# Patient Record
Sex: Male | Born: 1975 | Race: White | Hispanic: No | Marital: Single | Smoking: Current every day smoker
Health system: Southern US, Community
[De-identification: ages and names within clinical notes are randomized; demographics above are authoritative.]

## PROBLEM LIST (undated history)

## (undated) DIAGNOSIS — K5792 Diverticulitis of intestine, part unspecified, without perforation or abscess without bleeding: Secondary | ICD-10-CM

## (undated) DIAGNOSIS — F419 Anxiety disorder, unspecified: Secondary | ICD-10-CM

## (undated) DIAGNOSIS — I10 Essential (primary) hypertension: Secondary | ICD-10-CM

## (undated) DIAGNOSIS — L732 Hidradenitis suppurativa: Secondary | ICD-10-CM

## (undated) DIAGNOSIS — K219 Gastro-esophageal reflux disease without esophagitis: Secondary | ICD-10-CM

## (undated) DIAGNOSIS — K631 Perforation of intestine (nontraumatic): Secondary | ICD-10-CM

## (undated) HISTORY — DX: Hidradenitis suppurativa: L73.2

## (undated) HISTORY — PX: COLONOSCOPY: SHX174

---

## 2002-04-06 ENCOUNTER — Encounter: Payer: Self-pay | Admitting: Emergency Medicine

## 2002-04-06 ENCOUNTER — Emergency Department (HOSPITAL_COMMUNITY): Admission: EM | Admit: 2002-04-06 | Discharge: 2002-04-06 | Payer: Self-pay | Admitting: Emergency Medicine

## 2003-10-19 ENCOUNTER — Emergency Department (HOSPITAL_COMMUNITY): Admission: EM | Admit: 2003-10-19 | Discharge: 2003-10-20 | Payer: Self-pay | Admitting: Emergency Medicine

## 2008-08-21 ENCOUNTER — Encounter (INDEPENDENT_AMBULATORY_CARE_PROVIDER_SITE_OTHER): Payer: Self-pay | Admitting: *Deleted

## 2009-05-01 ENCOUNTER — Ambulatory Visit: Payer: Self-pay | Admitting: Internal Medicine

## 2009-05-01 DIAGNOSIS — R1084 Generalized abdominal pain: Secondary | ICD-10-CM

## 2009-05-01 DIAGNOSIS — R198 Other specified symptoms and signs involving the digestive system and abdomen: Secondary | ICD-10-CM

## 2009-05-01 DIAGNOSIS — K219 Gastro-esophageal reflux disease without esophagitis: Secondary | ICD-10-CM | POA: Insufficient documentation

## 2009-05-01 LAB — CONVERTED CEMR LAB
ALT: 30 units/L (ref 0–53)
AST: 21 units/L (ref 0–37)
Albumin: 4.3 g/dL (ref 3.5–5.2)
Alkaline Phosphatase: 68 units/L (ref 39–117)
BUN: 9 mg/dL (ref 6–23)
Basophils Absolute: 0 10*3/uL (ref 0.0–0.1)
Basophils Relative: 0.1 % (ref 0.0–3.0)
Bilirubin, Direct: 0.2 mg/dL (ref 0.0–0.3)
CO2: 30 meq/L (ref 19–32)
Calcium: 9.2 mg/dL (ref 8.4–10.5)
Chloride: 106 meq/L (ref 96–112)
Creatinine, Ser: 0.8 mg/dL (ref 0.4–1.5)
Eosinophils Absolute: 0.1 10*3/uL (ref 0.0–0.7)
Eosinophils Relative: 1 % (ref 0.0–5.0)
GFR calc non Af Amer: 118.94 mL/min (ref >60)
Glucose, Bld: 96 mg/dL (ref 70–99)
HCT: 50.8 % (ref 39.0–52.0)
Hemoglobin: 17.4 g/dL — ABNORMAL HIGH (ref 13.0–17.0)
Lymphocytes Relative: 25.7 % (ref 12.0–46.0)
Lymphs Abs: 2 10*3/uL (ref 0.7–4.0)
MCHC: 34.2 g/dL (ref 30.0–36.0)
MCV: 95 fL (ref 78.0–100.0)
Monocytes Absolute: 0.7 10*3/uL (ref 0.1–1.0)
Monocytes Relative: 8.6 % (ref 3.0–12.0)
Neutro Abs: 5 10*3/uL (ref 1.4–7.7)
Neutrophils Relative %: 64.6 % (ref 43.0–77.0)
Platelets: 206 10*3/uL (ref 150.0–400.0)
Potassium: 4.3 meq/L (ref 3.5–5.1)
RBC: 5.34 M/uL (ref 4.22–5.81)
RDW: 12.7 % (ref 11.5–14.6)
Sodium: 142 meq/L (ref 135–145)
TSH: 0.53 microintl units/mL (ref 0.35–5.50)
Tissue Transglutaminase Ab, IgA: 0.4 units (ref <7)
Total Bilirubin: 0.9 mg/dL (ref 0.3–1.2)
Total Protein: 7.4 g/dL (ref 6.0–8.3)
WBC: 7.8 10*3/uL (ref 4.5–10.5)

## 2009-05-15 ENCOUNTER — Ambulatory Visit: Payer: Self-pay | Admitting: Internal Medicine

## 2009-06-19 ENCOUNTER — Telehealth: Payer: Self-pay | Admitting: Internal Medicine

## 2009-06-20 ENCOUNTER — Telehealth: Payer: Self-pay | Admitting: Gastroenterology

## 2009-06-22 ENCOUNTER — Telehealth: Payer: Self-pay | Admitting: Internal Medicine

## 2012-03-18 ENCOUNTER — Other Ambulatory Visit: Payer: Self-pay | Admitting: Gastroenterology

## 2012-03-18 DIAGNOSIS — R1011 Right upper quadrant pain: Secondary | ICD-10-CM

## 2012-03-29 ENCOUNTER — Ambulatory Visit
Admission: RE | Admit: 2012-03-29 | Discharge: 2012-03-29 | Disposition: A | Discharge status: Home / Self Care | Payer: BC Managed Care – PPO | Source: Ambulatory Visit | Attending: Gastroenterology | Admitting: Gastroenterology

## 2012-03-29 DIAGNOSIS — R1011 Right upper quadrant pain: Secondary | ICD-10-CM

## 2013-11-20 ENCOUNTER — Emergency Department: Payer: Self-pay | Admitting: Emergency Medicine

## 2013-11-20 LAB — BASIC METABOLIC PANEL
Anion Gap: 6 — ABNORMAL LOW (ref 7–16)
BUN: 7 mg/dL (ref 7–18)
CALCIUM: 8.8 mg/dL (ref 8.5–10.1)
CO2: 27 mmol/L (ref 21–32)
Chloride: 103 mmol/L (ref 98–107)
Creatinine: 0.88 mg/dL (ref 0.60–1.30)
EGFR (African American): >60
EGFR (Non-African Amer.): >60
GLUCOSE: 86 mg/dL (ref 65–99)
OSMOLALITY: 269 (ref 275–301)
Potassium: 3.4 mmol/L — ABNORMAL LOW (ref 3.5–5.1)
SODIUM: 136 mmol/L (ref 136–145)

## 2013-11-20 LAB — CBC WITH DIFFERENTIAL/PLATELET
Basophil #: 0.1 10*3/uL (ref 0.0–0.1)
Basophil %: 0.7 %
EOS PCT: 0.3 %
Eosinophil #: 0 10*3/uL (ref 0.0–0.7)
HCT: 52 % (ref 40.0–52.0)
HGB: 17.4 g/dL (ref 13.0–18.0)
LYMPHS PCT: 17.1 %
Lymphocyte #: 2.4 10*3/uL (ref 1.0–3.6)
MCH: 31.4 pg (ref 26.0–34.0)
MCHC: 33.4 g/dL (ref 32.0–36.0)
MCV: 94 fL (ref 80–100)
Monocyte #: 1.4 x10 3/mm — ABNORMAL HIGH (ref 0.2–1.0)
Monocyte %: 9.7 %
NEUTROS PCT: 72.2 %
Neutrophil #: 10.3 10*3/uL — ABNORMAL HIGH (ref 1.4–6.5)
Platelet: 220 10*3/uL (ref 150–440)
RBC: 5.53 10*6/uL (ref 4.40–5.90)
RDW: 13.7 % (ref 11.5–14.5)
WBC: 14.2 10*3/uL — AB (ref 3.8–10.6)

## 2013-11-25 LAB — WOUND CULTURE

## 2014-05-17 DIAGNOSIS — L732 Hidradenitis suppurativa: Secondary | ICD-10-CM | POA: Insufficient documentation

## 2014-06-07 DIAGNOSIS — Z720 Tobacco use: Secondary | ICD-10-CM | POA: Insufficient documentation

## 2015-01-11 ENCOUNTER — Ambulatory Visit
Admit: 2015-01-11 | Disposition: A | Discharge status: Home / Self Care | Payer: Self-pay | Attending: Family Medicine | Admitting: Family Medicine

## 2015-04-05 ENCOUNTER — Encounter: Payer: Self-pay | Admitting: Internal Medicine

## 2015-05-26 ENCOUNTER — Emergency Department: Payer: 59

## 2015-05-26 ENCOUNTER — Other Ambulatory Visit: Payer: Self-pay

## 2015-05-26 ENCOUNTER — Encounter: Payer: Self-pay | Admitting: Emergency Medicine

## 2015-05-26 ENCOUNTER — Inpatient Hospital Stay
Admission: EM | Admit: 2015-05-26 | Discharge: 2015-05-30 | DRG: 392 | Disposition: A | Discharge status: Home / Self Care | Payer: 59 | Attending: Internal Medicine | Admitting: Internal Medicine

## 2015-05-26 DIAGNOSIS — K5792 Diverticulitis of intestine, part unspecified, without perforation or abscess without bleeding: Secondary | ICD-10-CM | POA: Diagnosis not present

## 2015-05-26 DIAGNOSIS — K219 Gastro-esophageal reflux disease without esophagitis: Secondary | ICD-10-CM | POA: Diagnosis present

## 2015-05-26 DIAGNOSIS — A419 Sepsis, unspecified organism: Secondary | ICD-10-CM

## 2015-05-26 DIAGNOSIS — F17210 Nicotine dependence, cigarettes, uncomplicated: Secondary | ICD-10-CM | POA: Diagnosis present

## 2015-05-26 DIAGNOSIS — Z8249 Family history of ischemic heart disease and other diseases of the circulatory system: Secondary | ICD-10-CM | POA: Diagnosis not present

## 2015-05-26 DIAGNOSIS — Z833 Family history of diabetes mellitus: Secondary | ICD-10-CM

## 2015-05-26 DIAGNOSIS — F419 Anxiety disorder, unspecified: Secondary | ICD-10-CM | POA: Diagnosis present

## 2015-05-26 DIAGNOSIS — E860 Dehydration: Secondary | ICD-10-CM | POA: Diagnosis present

## 2015-05-26 DIAGNOSIS — I10 Essential (primary) hypertension: Secondary | ICD-10-CM | POA: Diagnosis present

## 2015-05-26 DIAGNOSIS — K5720 Diverticulitis of large intestine with perforation and abscess without bleeding: Principal | ICD-10-CM | POA: Diagnosis present

## 2015-05-26 DIAGNOSIS — K631 Perforation of intestine (nontraumatic): Secondary | ICD-10-CM | POA: Diagnosis present

## 2015-05-26 HISTORY — DX: Essential (primary) hypertension: I10

## 2015-05-26 HISTORY — DX: Gastro-esophageal reflux disease without esophagitis: K21.9

## 2015-05-26 HISTORY — DX: Anxiety disorder, unspecified: F41.9

## 2015-05-26 HISTORY — DX: Diverticulitis of intestine, part unspecified, without perforation or abscess without bleeding: K57.92

## 2015-05-26 LAB — COMPREHENSIVE METABOLIC PANEL
ALK PHOS: 66 U/L (ref 38–126)
ALT: 25 U/L (ref 17–63)
ANION GAP: 10 (ref 5–15)
AST: 20 U/L (ref 15–41)
Albumin: 4 g/dL (ref 3.5–5.0)
BILIRUBIN TOTAL: 1.6 mg/dL — AB (ref 0.3–1.2)
BUN: 8 mg/dL (ref 6–20)
CALCIUM: 9.1 mg/dL (ref 8.9–10.3)
CO2: 25 mmol/L (ref 22–32)
Chloride: 102 mmol/L (ref 101–111)
Creatinine, Ser: 0.84 mg/dL (ref 0.61–1.24)
GFR calc non Af Amer: >60 mL/min (ref >60)
GLUCOSE: 91 mg/dL (ref 65–99)
Potassium: 3.6 mmol/L (ref 3.5–5.1)
Sodium: 137 mmol/L (ref 135–145)
TOTAL PROTEIN: 7.3 g/dL (ref 6.5–8.1)

## 2015-05-26 LAB — URINALYSIS COMPLETE WITH MICROSCOPIC (ARMC ONLY)
Bacteria, UA: NONE SEEN
Bilirubin Urine: NEGATIVE
Glucose, UA: NEGATIVE mg/dL
Hgb urine dipstick: NEGATIVE
Leukocytes, UA: NEGATIVE
Nitrite: NEGATIVE
Protein, ur: NEGATIVE mg/dL
Specific Gravity, Urine: 1.013 (ref 1.005–1.030)
pH: 7 (ref 5.0–8.0)

## 2015-05-26 LAB — CBC WITH DIFFERENTIAL/PLATELET
Basophils Absolute: 0.1 10*3/uL (ref 0–0.1)
Basophils Relative: 0 %
EOS PCT: 0 %
Eosinophils Absolute: 0 10*3/uL (ref 0–0.7)
HCT: 49 % (ref 40.0–52.0)
Hemoglobin: 16.8 g/dL (ref 13.0–18.0)
LYMPHS ABS: 1.5 10*3/uL (ref 1.0–3.6)
LYMPHS PCT: 9 %
MCH: 31.9 pg (ref 26.0–34.0)
MCHC: 34.3 g/dL (ref 32.0–36.0)
MCV: 93.1 fL (ref 80.0–100.0)
MONO ABS: 1.8 10*3/uL — AB (ref 0.2–1.0)
Monocytes Relative: 12 %
Neutro Abs: 12.5 10*3/uL — ABNORMAL HIGH (ref 1.4–6.5)
Neutrophils Relative %: 79 %
PLATELETS: 210 10*3/uL (ref 150–440)
RBC: 5.27 MIL/uL (ref 4.40–5.90)
RDW: 14.1 % (ref 11.5–14.5)
WBC: 15.9 10*3/uL — ABNORMAL HIGH (ref 3.8–10.6)

## 2015-05-26 LAB — LIPASE, BLOOD: Lipase: 23 U/L (ref 22–51)

## 2015-05-26 LAB — TROPONIN I

## 2015-05-26 MED ORDER — ONDANSETRON HCL 4 MG PO TABS
4.0000 mg | ORAL_TABLET | Freq: Four times a day (QID) | ORAL | Status: DC | PRN
Start: 2015-05-26 — End: 2015-05-30
  Administered 2015-05-28: 4 mg via ORAL
  Filled 2015-05-26: qty 1

## 2015-05-26 MED ORDER — SODIUM CHLORIDE 0.9 % IV BOLUS (SEPSIS)
1000.0000 mL | Freq: Once | INTRAVENOUS | Status: AC
  Administered 2015-05-26: 1000 mL via INTRAVENOUS

## 2015-05-26 MED ORDER — ONDANSETRON HCL 4 MG/2ML IJ SOLN
4.0000 mg | Freq: Four times a day (QID) | INTRAMUSCULAR | Status: DC | PRN
  Administered 2015-05-26 – 2015-05-28 (×4): 4 mg via INTRAVENOUS
  Filled 2015-05-26 (×4): qty 2

## 2015-05-26 MED ORDER — SODIUM CHLORIDE 0.9 % IV SOLN
INTRAVENOUS | Status: AC
  Administered 2015-05-27: 01:00:00 via INTRAVENOUS

## 2015-05-26 MED ORDER — ONDANSETRON HCL 4 MG/2ML IJ SOLN
4.0000 mg | Freq: Once | INTRAMUSCULAR | Status: AC
  Administered 2015-05-26: 4 mg via INTRAVENOUS
  Filled 2015-05-26: qty 2

## 2015-05-26 MED ORDER — SODIUM CHLORIDE 0.9 % IV SOLN
1.0000 g | Freq: Three times a day (TID) | INTRAVENOUS | Status: DC
  Administered 2015-05-27 – 2015-05-30 (×11): 1 g via INTRAVENOUS
  Filled 2015-05-26 (×15): qty 1

## 2015-05-26 MED ORDER — MORPHINE SULFATE (PF) 4 MG/ML IV SOLN
4.0000 mg | Freq: Once | INTRAVENOUS | Status: AC
  Administered 2015-05-26: 4 mg via INTRAVENOUS

## 2015-05-26 MED ORDER — SODIUM CHLORIDE 0.9 % IV SOLN
Freq: Once | INTRAVENOUS | Status: AC
  Administered 2015-05-26: 17:00:00 via INTRAVENOUS

## 2015-05-26 MED ORDER — SODIUM CHLORIDE 0.9 % IJ SOLN
3.0000 mL | Freq: Two times a day (BID) | INTRAMUSCULAR | Status: DC
  Administered 2015-05-27 – 2015-05-30 (×6): 3 mL via INTRAVENOUS

## 2015-05-26 MED ORDER — IOHEXOL 240 MG/ML SOLN
25.0000 mL | Freq: Once | INTRAMUSCULAR | Status: AC | PRN
  Administered 2015-05-26: 25 mL via ORAL

## 2015-05-26 MED ORDER — MORPHINE SULFATE (PF) 4 MG/ML IV SOLN
INTRAVENOUS | Status: AC
  Filled 2015-05-26: qty 1

## 2015-05-26 MED ORDER — MORPHINE SULFATE (PF) 4 MG/ML IV SOLN
6.0000 mg | Freq: Once | INTRAVENOUS | Status: AC
  Administered 2015-05-26: 6 mg via INTRAVENOUS
  Filled 2015-05-26: qty 2

## 2015-05-26 MED ORDER — PANTOPRAZOLE SODIUM 40 MG PO TBEC
40.0000 mg | DELAYED_RELEASE_TABLET | Freq: Every day | ORAL | Status: DC
  Administered 2015-05-27 – 2015-05-29 (×3): 40 mg via ORAL
  Filled 2015-05-26 (×5): qty 1

## 2015-05-26 MED ORDER — IOHEXOL 350 MG/ML SOLN
100.0000 mL | Freq: Once | INTRAVENOUS | Status: AC | PRN
  Administered 2015-05-26: 100 mL via INTRAVENOUS

## 2015-05-26 MED ORDER — MORPHINE SULFATE (PF) 4 MG/ML IV SOLN
8.0000 mg | Freq: Once | INTRAVENOUS | Status: AC
  Administered 2015-05-26: 8 mg via INTRAVENOUS
  Filled 2015-05-26 (×2): qty 2

## 2015-05-26 MED ORDER — ACETAMINOPHEN 325 MG PO TABS
650.0000 mg | ORAL_TABLET | Freq: Four times a day (QID) | ORAL | Status: DC | PRN
  Administered 2015-05-28 – 2015-05-29 (×2): 650 mg via ORAL
  Filled 2015-05-26 (×2): qty 2

## 2015-05-26 MED ORDER — ACETAMINOPHEN 650 MG RE SUPP: 650.0000 mg | Freq: Four times a day (QID) | RECTAL | Status: DC | PRN

## 2015-05-26 MED ORDER — ENOXAPARIN SODIUM 40 MG/0.4ML ~~LOC~~ SOLN
40.0000 mg | SUBCUTANEOUS | Status: DC
  Administered 2015-05-27 – 2015-05-29 (×4): 40 mg via SUBCUTANEOUS
  Filled 2015-05-26 (×4): qty 0.4

## 2015-05-26 MED ORDER — MORPHINE SULFATE (PF) 2 MG/ML IV SOLN
2.0000 mg | INTRAVENOUS | Status: DC | PRN
  Administered 2015-05-26 – 2015-05-30 (×16): 2 mg via INTRAVENOUS
  Filled 2015-05-26 (×16): qty 1

## 2015-05-26 MED ORDER — CLONAZEPAM 0.5 MG PO TABS
0.5000 mg | ORAL_TABLET | Freq: Two times a day (BID) | ORAL | Status: DC | PRN
Start: 2015-05-26 — End: 2015-05-30
  Administered 2015-05-27 – 2015-05-29 (×5): 0.5 mg via ORAL
  Filled 2015-05-26 (×5): qty 1

## 2015-05-26 NOTE — ED Notes (Signed)
Admitting MD at bedside.

## 2015-05-26 NOTE — ED Notes (Addendum)
Patient to ER with mother for c/o "diverticulitis pain". Patient rolling around on stretcher in triage. Mother states she is concerned patient is going "to die" and wants to see GI specialist. Mother states "patient needs immediate attention". Unable to obtain accurate triage d/t patient condition/family input.

## 2015-05-26 NOTE — ED Provider Notes (Signed)
[facility] Emergency Department Provider Note  ____________________________________________  Time seen: Approximately 4:13 PM  I have reviewed the triage vital signs and the nursing notes.   HISTORY  Chief Complaint Abdominal Pain  Patient reports he feels something horribly bad is going on in his stomach has a severe burning pain in his lower abdomen  HPI Tanner Wells is a 39 y.o. male who gives me the history of lower abdominal pain and burning in nature starting on Thursday 3 days ago. The pain is burning in nature he is very nauseated. She went to see [facility] early today and got 2 different antibiotics which was stolen. He is not any better in fact he's worse. She convinced that something is twisted her ribs in his abdomen. Patient complains of severe abdominal pain and was unable to sleep last night because of it. Patient denies any sun exposure but is very red especially in the face. His mother tells me that this is new for him. Patient reports that the bumps in the road or sitting down makes the pain worse and nothing really makes it better except possibly laying still   Past Medical History  Diagnosis Date  . Diverticulitis   . Hypertension   . Anxiety   . GERD (gastroesophageal reflux disease)     Patient Active Problem List   Diagnosis Date Noted  . Acute diverticulitis 05/26/2015  . 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 Benign essential HTN 05/26/2015  . Anxiety 05/26/2015  . Dehydration 05/26/2015  . GERD (gastroesophageal reflux disease) 05/26/2015  . Sepsis 05/26/2015  . GERD 05/01/2009  . OTHER SYMPTOMS INVOLVING DIGESTIVE SYSTEM OTHER 05/01/2009  . ABDOMINAL PAIN -GENERALIZED 05/01/2009   patient says he has irritable bowel disease and diverticulitis  Past Surgical History  Procedure Laterality Date  . Colonoscopy      No current outpatient prescriptions on file.  Allergies Bee venom  Family History  Problem  Relation Age of Onset  . Hypertension Father   . Lung cancer Maternal Uncle   . Lung cancer Maternal Aunt   . Diabetes Mother     Social History Social History  Substance Use Topics  . Smoking status: Current Every Day Smoker -- 1.00 packs/day    Types: Cigarettes  . Smokeless tobacco: Current User  . Alcohol Use: Yes     Comment: 6 drinks/week   patient smokes  Review of Systems Constitutional: No fever/chills Eyes: No visual changes. ENT: No sore throat. Patient says he has a metallic taste in his mouth Cardiovascular: Denies chest pain. Respiratory: Denies shortness of breath. Gastrointestinal: Patient has abdominal pain and nausea no vomiting.  No diarrhea.  No constipation. Genitourinary: Patient has some burning when he urinates but not much Musculoskeletal: Negative for back pain. Skin: Negative for rash. Neurological: Negative for headaches, focal weakness or numbness.  10-point ROS otherwise negative.  ____________________________________________   PHYSICAL EXAM:  VITAL SIGNS: ED Triage Vitals  Enc Vitals Group     BP 05/26/15 1549 162/81 mmHg     Pulse Rate 05/26/15 1549 106     Resp 05/26/15 1549 20     Temp 05/26/15 1549 98.4 F (36.9 C)     Temp Source 05/26/15 1549 Oral     SpO2 05/26/15 1549 99 %     Weight 05/26/15 1549 250 lb (113.399 kg)     Height 05/26/15 1549  (1.778 m)     Head Cir --      Peak Flow --  Pain Score --      Pain Loc --      Pain Edu? --      Excl. in GC? --     Constitutional: Alert and oriented. Patient is very flushed and says he's hurting a lot Eyes: Conjunctivae are normal. PERRL. EOMI. Head: Atraumatic. Nose: No congestion/rhinnorhea. Mouth/Throat: Mucous membranes are moist.  Oropharynx non-erythematous. Neck: No stridor. Cardiovascular: Normal rate, regular rhythm. Grossly normal heart sounds.  Good peripheral circulation. Respiratory: Normal respiratory effort.  No retractions. Lungs  CTAB. Gastrointestinal: Soft patient is tender in the lower half of his abdomen to palpation and percussion. The tenderness increases the lower in the abdomen ago bowel sounds are decreased No distention. No abdominal bruits. No CVA tenderness. Musculoskeletal: No lower extremity tenderness nor edema.  No joint effusions. Neurologic:  Normal speech and language. No gross focal neurologic deficits are appreciated. No gait instability. Skin:  Skin is warm, dry and intact. No rash noted. Psychiatric: Mood and affect are normal. Speech and behavior are normal.  ____________________________________________   LABS (all labs ordered are listed, but only abnormal results are displayed)  Labs Reviewed  COMPREHENSIVE METABOLIC PANEL - Abnormal; Notable for the following:    Total Bilirubin 1.6 (*)    All other components within normal limits  CBC WITH DIFFERENTIAL/PLATELET - Abnormal; Notable for the following:    WBC 15.9 (*)    Neutro Abs 12.5 (*)    Monocytes Absolute 1.8 (*)    All other components within normal limits  URINALYSIS COMPLETEWITH MICROSCOPIC ([facility] ONLY) - Abnormal; Notable for the following:    Color, Urine YELLOW (*)    APPearance CLEAR (*)    Ketones, ur 1+ (*)    Squamous Epithelial / LPF 0-5 (*)    All other components within normal limits  CBC - Abnormal; Notable for the following:    WBC 16.6 (*)    All other components within normal limits  CULTURE, BLOOD (ROUTINE X 2)  CULTURE, BLOOD (ROUTINE X 2)  LIPASE, BLOOD  TROPONIN I  BASIC METABOLIC PANEL  LACTIC ACID, PLASMA  URINALYSIS COMPLETEWITH MICROSCOPIC ([facility] ONLY)   ____________________________________________  EKG  EKG read and interpreted by me shows normal sinus rhythm normal axis and normal ST-T wave normal EKG ____________________________________________  RADIOLOGY  CT per radiology shows diverticulitis with perforation 1 cm free air. I have reviewed the films and  agree ____________________________________________   PROCEDURES    ____________________________________________   INITIAL IMPRESSION / ASSESSMENT AND PLAN / ED COURSE  Pertinent labs & imaging results that were available during my care of the patient were reviewed by me and considered in my medical decision making (see chart for details).  _________________________________________   FINAL CLINICAL IMPRESSION(S) / ED DIAGNOSES  Final diagnoses:  Diverticulitis of large intestine with perforation without bleeding      Arnaldo Natal, MD 05/27/15 0021

## 2015-05-26 NOTE — ED Notes (Signed)
Pt's family state pt went to [facility] this morning with abd pain and was diagnosed with diverticulitis flare-up and given antibiotics. Pt presents to ED c/o abd pain that has worsened since visit to [facility], 10/10 to [facility].

## 2015-05-26 NOTE — ED Notes (Signed)
Patient returned to room from CT. 

## 2015-05-26 NOTE — ED Notes (Signed)
Patient transported to CT 

## 2015-05-26 NOTE — Consult Note (Signed)
Tanner Wells is a 39 y.o. male  several days of abdominal pain.  HPI: He has a history of previous episodes of diverticulitis. He's never been hospitalized for that problem. He's not been treated with antibiotics as far as he is aware. He's had several days of increasing abdominal pain culminating in severe episode this evening. He presented emergently for further evaluation.  He also has mild nausea but has not vomited. He's had a significant fever and has had multiple shaking chills. He is having reasonable bowel function has noticed some blood in his stool. He's had no previous abdominal surgery. Specifically he denies history of hepatitis, yellow jaundice, pancreatitis, peptic ulcer disease, or gallbladder disease. He is no other major medical problems. His cigarette smoker.  Workup in the emergency revealed an elevated white blood cell count and CT demonstrates what appears to be  perforated diverticulitis in the sigmoid area. Surgical service was consulted  Past Medical History  Diagnosis Date  . Diverticulitis   . Hypertension   . Anxiety   . GERD (gastroesophageal reflux disease)    Past Surgical History  Procedure Laterality Date  . Colonoscopy     Social History   Social History  . Marital Status: Single    Spouse Name: N/A  . Number of Children: N/A  . Years of Education: N/A   Social History Main Topics  . Smoking status: Current Every Day Smoker -- 1.00 packs/day    Types: Cigarettes  . Smokeless tobacco: Current User  . Alcohol Use: Yes     Comment: 6 drinks/week  . Drug Use: Yes    Special: Marijuana  . Sexual Activity: Not Asked   Other Topics Concern  . None   Social History Narrative  . None    Review of Systems: Review of Systems  Constitutional: Positive for fever, chills and diaphoresis. Negative for weight loss and malaise/fatigue.  HENT: Negative.   Eyes: Negative.   Respiratory: Negative.   Cardiovascular: Negative.    Gastrointestinal: Positive for nausea and abdominal pain. Negative for vomiting.  Genitourinary: Negative.   Musculoskeletal: Negative.   Skin: Negative.   Neurological: Negative.   Psychiatric/Behavioral: Negative.     PHYSICAL EXAM: BP 130/86 mmHg  Pulse 96  Temp(Src) 98 F (36.7 C) (Oral)  Resp 21  Ht  (1.778 m)  Wt 265 lb 8 oz (120.43 kg)  BMI 38.10 kg/m2  SpO2 100%  Physical Exam  Constitutional: He is oriented to person, place, and time. He appears well-developed and well-nourished.  Flushed  HENT:  Head: Normocephalic and atraumatic.  Eyes: EOM are normal. Pupils are equal, round, and reactive to light.  Neck: Normal range of motion. Neck supple.  Cardiovascular: Regular rhythm and normal heart sounds.   Pulmonary/Chest: Effort normal and breath sounds normal.  Abdominal: Soft. There is tenderness. There is guarding. There is no rebound.  Musculoskeletal: Normal range of motion. He exhibits no edema or tenderness.  Neurological: He is alert and oriented to person, place, and time.  Skin: Skin is warm and dry.  Psychiatric: He has a normal mood and affect. Judgment normal.   His abdomen is markedly tender to suprapubic left lower quadrant area with marked guarding. He has no rebound. He has hypoactive bowel sounds.  Impression/Plan: I independently reviewed his CT scan. He does appear to have a perforated diverticulitis in the sigmoid area. There is some stranding free air or free fluid. There is no specific abscess formation. I talk with the patient  in detail. His family is present. We will plan antibiotic therapy and follow-up over the next couple of days. He will need surgery at some point to resect this involved segment of sigmoid. If we can delayed several weeks to months when he likely can avoid a temporary colostomy. The patient and his family in agreement with this plan.   Tiney Rouge III, MD  05/26/2015, 9:37 PM

## 2015-05-26 NOTE — Progress Notes (Signed)
ANTIBIOTIC CONSULT NOTE - INITIAL  Pharmacy Consult for meropenem Indication: [facility]  Allergies  Allergen Reactions  . Bee Venom Swelling    Patient Measurements: Height:  (177.8 cm) Weight: 255 lb 3.2 oz (115.758 kg) IBW/kg (Calculated) : 73 Adjusted Body Weight: 90 kg  Vital Signs: Temp: 98.2 F (36.8 C) (09/03 2151) Temp Source: Oral (09/03 2151) BP: 149/88 mmHg (09/03 2151) Pulse Rate: 96 (09/03 2151) Intake/Output from previous day:   Intake/Output from this shift:    Labs:  Recent Labs  05/26/15 1643  WBC 15.9*  HGB 16.8  PLT 210  CREATININE 0.84   Estimated Creatinine Clearance: 152 mL/min (by C-G formula based on Cr of 0.84). No results for input(s): VANCOTROUGH, VANCOPEAK, VANCORANDOM, GENTTROUGH, GENTPEAK, GENTRANDOM, TOBRATROUGH, TOBRAPEAK, TOBRARND, AMIKACINPEAK, AMIKACINTROU, AMIKACIN in the last 72 hours.   Microbiology: No results found for this or any previous visit (from the past 720 hour(s)).  Medical History: Past Medical History  Diagnosis Date  . Diverticulitis   . Hypertension   . Anxiety   . GERD (gastroesophageal reflux disease)     Medications:  Infusions:  . sodium chloride     Assessment: 39 yom cc severe abdominal pain with history of diverticulitis and radiologic evidence on CT of apparent perforated diverticulitis in sigmoid area starting empirically on meropenem.  Goal of Therapy:  Resolve infection  Plan:  Meropenem 1 gm IV Q8H  Carola Frost, Pharm.D. Clinical Pharmacist 05/26/2015,10:18 PM

## 2015-05-26 NOTE — ED Notes (Signed)
Pt provided ice chips.

## 2015-05-26 NOTE — H&P (Signed)
[facility] [place] at [facility]   PATIENT NAME: Tanner Wells    MR#:  [id number]  DATE OF BIRTH:  August 22, 1976  DATE OF ADMISSION:  05/26/2015  PRIMARY CARE PHYSICIAN: Yancey Flemings, MD   REQUESTING/REFERRING PHYSICIAN: Darnelle Catalan, M.D.  CHIEF COMPLAINT:   Chief Complaint  Patient presents with  . Abdominal Pain    HISTORY OF PRESENT ILLNESS:  Tanner Wells  is a 39 y.o. male who presents with lower abdominal pain with nausea 3 days. Patient states that he was seen at an urgent care clinic and was given 2 different antibiotics, without any improvement. He states that he has had diverticulitis before, but he has never been hospitalized for it in the past. This time his pain and nausea were progressing, and he was having significant chills at home, which prompted him to come in to the ED for evaluation. He denies any alleviating factors. He states that he's had poor by mouth intake for the past couple of days due to the same. In the ED was found to be tachycardic with an elevated white count. CT of his abdomen showed diverticulitis with diverticular perforation with a small amount of gas in the abdomen without any abscess. Hospitalists were called for admission for the same, and per the ED physician surgery was to be called to come and evaluate the patient as well.  PAST MEDICAL HISTORY:   Past Medical History  Diagnosis Date  . Diverticulitis   . Hypertension   . Anxiety   . GERD (gastroesophageal reflux disease)     PAST SURGICAL HISTORY:   Past Surgical History  Procedure Laterality Date  . Colonoscopy      SOCIAL HISTORY:   Social History  Substance Use Topics  . Smoking status: Current Every Day Smoker -- 1.00 packs/day    Types: Cigarettes  . Smokeless tobacco: Current User  . Alcohol Use: Yes     Comment: 6 drinks/week    FAMILY HISTORY:   Family History  Problem Relation Age of Onset  . Hypertension Father   . Lung cancer  Maternal Uncle   . Lung cancer Maternal Aunt   . Diabetes Mother     DRUG ALLERGIES:   Allergies  Allergen Reactions  . Bee Venom Swelling    MEDICATIONS AT HOME:   Prior to Admission medications   Medication Sig Start Date End Date Taking? Authorizing Provider  AMITIZA 24 MCG capsule Take 24 mcg by mouth daily. 03/01/15  Yes Historical Provider, MD  Benzoyl Peroxide 10 % CREA Apply 1 application topically at bedtime as needed. 11/09/14  Yes Historical Provider, MD  clonazePAM (KLONOPIN) 1 MG tablet Take 0.5 mg by mouth 2 (two) times daily. 03/13/15  Yes Historical Provider, MD  DEXILANT 60 MG capsule Take 60 mg by mouth daily. 05/08/15  Yes Historical Provider, MD  doxycycline (MONODOX) 100 MG capsule Take 100 mg by mouth 2 (two) times daily. Routine medication 03/17/15  Yes Historical Provider, MD    REVIEW OF SYSTEMS:  Review of Systems  Constitutional: Positive for chills and malaise/fatigue. Negative for fever and weight loss.  HENT: Negative for ear pain, hearing loss and tinnitus.   Eyes: Negative for blurred vision, double vision, pain and redness.  Respiratory: Negative for cough, hemoptysis and shortness of breath.   Cardiovascular: Negative for chest pain, palpitations, orthopnea and leg swelling.  Gastrointestinal: Positive for nausea and abdominal pain. Negative for vomiting, diarrhea and constipation.  Genitourinary: Negative for dysuria, frequency and  hematuria.  Musculoskeletal: Negative for back pain, joint pain and neck pain.  Skin:       No acne, rash, or lesions  Neurological: Negative for dizziness, tremors, focal weakness and weakness.  Endo/Heme/Allergies: Negative for polydipsia. Does not bruise/bleed easily.  Psychiatric/Behavioral: Negative for depression. The patient is not nervous/anxious and does not have insomnia.      VITAL SIGNS:   Filed Vitals:   05/26/15 1901 05/26/15 1930 05/26/15 2000 05/26/15 2030  BP: 119/80 117/84 135/82 128/88  Pulse: 111  93 102 98  Temp:      TempSrc:      Resp: 30 24 25 21   Height:      Weight:      SpO2: 95% 96% 93% 92%   Wt Readings from Last 3 Encounters:  05/26/15 120.43 kg (265 lb 8 oz)  05/01/09 111.585 kg (246 lb)    PHYSICAL EXAMINATION:  Physical Exam  Vitals reviewed. Constitutional: He is oriented to person, place, and time. He appears well-developed and well-nourished. He appears distressed (mild).  HENT:  Head: Normocephalic and atraumatic.  Dry mucous membranes  Eyes: Conjunctivae and EOM are normal. Pupils are equal, round, and reactive to light. No scleral icterus.  Neck: Normal range of motion. Neck supple. No JVD present. No thyromegaly present.  Cardiovascular: Normal rate, regular rhythm and intact distal pulses.  Exam reveals no gallop and no friction rub.   No murmur heard. Respiratory: Effort normal and breath sounds normal. No respiratory distress. He has no wheezes. He has no rales.  GI: Soft. Bowel sounds are normal. He exhibits no distension. There is tenderness (Lower abdomen).  Musculoskeletal: Normal range of motion. He exhibits no edema.  No arthritis, no gout  Lymphadenopathy:    He has no cervical adenopathy.  Neurological: He is alert and oriented to person, place, and time. No cranial nerve deficit.  No dysarthria, no aphasia  Skin: Skin is warm and dry. No rash noted. No erythema.  Psychiatric: He has a normal mood and affect. His behavior is normal. Judgment and thought content normal.    LABORATORY PANEL:   CBC  Recent Labs Lab 05/26/15 1643  WBC 15.9*  HGB 16.8  HCT 49.0  PLT 210   ------------------------------------------------------------------------------------------------------------------  Chemistries   Recent Labs Lab 05/26/15 1643  NA 137  K 3.6  CL 102  CO2 25  GLUCOSE 91  BUN 8  CREATININE 0.84  CALCIUM 9.1  AST 20  ALT 25  ALKPHOS 66  BILITOT 1.6*    ------------------------------------------------------------------------------------------------------------------  Cardiac Enzymes  Recent Labs Lab 05/26/15 1643  TROPONINI <0.03   ------------------------------------------------------------------------------------------------------------------  RADIOLOGY:  Ct Abdomen Pelvis W Contrast  05/26/2015   CLINICAL DATA:  Severe abdominal pain and chills  EXAM: CT ABDOMEN AND PELVIS WITH CONTRAST  TECHNIQUE: Multidetector CT imaging of the abdomen and pelvis was performed using the standard protocol following bolus administration of intravenous contrast.  CONTRAST:  OMNIPAQUE IOHEXOL 350 MG/ML SOLN  COMPARISON:  None.  FINDINGS: Lower chest:  Negative  Hepatobiliary: The liver is homogeneous without focal mass lesion. Normal liver density. Gallbladder and bile ducts normal  Pancreas: Negative  Spleen: Negative  Adrenals/Urinary Tract: Normal kidneys. No renal mass or obstruction. No urinary tract calculi. Urinary bladder normal.  Stomach/Bowel: Normal stomach and small bowel. Normal appendix. There is edema in the sigmoid colon with thickening of the colon and surrounding edema in the fat compatible with diverticulitis. There is a small perforated diverticulum posteriorly with a 1  cm gas collection in the fat consistent with extraluminal gas. No focal fluid collection identified. Negative for bowel obstruction. There is fluid along the fascia posterior to the sigmoid colon.  Vascular/Lymphatic: Normal aorta and vena cava.  Reproductive: Slight prostate enlargement. Minimal free fluid in the pelvis  Other: Negative for adenopathy.  Musculoskeletal: No acute bony abnormality.  IMPRESSION: Acute diverticulitis of the sigmoid colon. There is thickening of the colon with surrounding edema. There is a small perforation the posterior wall of the sigmoid colon with extraluminal gas noted. No abscess identified.   Electronically Signed   By: Marlan Palau  M.D.   On: 05/26/2015 19:45    EKG:   Orders placed or performed during the hospital encounter of 05/26/15  . ED EKG  . ED EKG    IMPRESSION AND PLAN:  Principal Problem:   Sepsis - IV antibiotics ordered in the ED, source is his diverticulitis, we will order blood cultures, UA, lactic acid. Patient is hemodynamically stable at this time. He is a little dehydrated, see below, therefore we will give him some IV fluids. Active Problems:   Acute diverticulitis - patient is having the same in the past, but never this severe. IV meropenem, fluids as above.   Intestinal perforation - with a small amount of gas in the abdomen per CT scan, without frank abscess, defer to surgery's recommendations.   Dehydration - likely due to poor by mouth intake, hemodynamically stable, fluids for rehydration.   Benign essential HTN - currently controlled, hold antihypertensives for now   Anxiety - continue home meds for this   GERD (gastroesophageal reflux disease) - equivalent home dose PPI  All the records are reviewed and case discussed with ED provider. Management plans discussed with the patient and/or family.  DVT PROPHYLAXIS: SubQ lovenox  ADMISSION STATUS: Inpatient  CODE STATUS: Full  TOTAL TIME TAKING CARE OF THIS PATIENT: 50 minutes.    Tanner Wells 05/26/2015, 9:03 PM  [facility]  Office  [phone]  CC: Primary care physician; Yancey Flemings, MD

## 2015-05-27 LAB — CBC
HEMATOCRIT: 45.4 % (ref 40.0–52.0)
Hemoglobin: 15.4 g/dL (ref 13.0–18.0)
MCH: 31.6 pg (ref 26.0–34.0)
MCHC: 33.9 g/dL (ref 32.0–36.0)
MCV: 93.2 fL (ref 80.0–100.0)
Platelets: 188 10*3/uL (ref 150–440)
RBC: 4.87 MIL/uL (ref 4.40–5.90)
RDW: 13.8 % (ref 11.5–14.5)
WBC: 16.6 10*3/uL — AB (ref 3.8–10.6)

## 2015-05-27 LAB — BASIC METABOLIC PANEL
ANION GAP: 7 (ref 5–15)
BUN: 7 mg/dL (ref 6–20)
CHLORIDE: 102 mmol/L (ref 101–111)
CO2: 24 mmol/L (ref 22–32)
Calcium: 8.1 mg/dL — ABNORMAL LOW (ref 8.9–10.3)
Creatinine, Ser: 0.8 mg/dL (ref 0.61–1.24)
Glucose, Bld: 96 mg/dL (ref 65–99)
POTASSIUM: 3.3 mmol/L — AB (ref 3.5–5.1)
SODIUM: 133 mmol/L — AB (ref 135–145)

## 2015-05-27 LAB — URINALYSIS COMPLETE WITH MICROSCOPIC (ARMC ONLY)
BACTERIA UA: NONE SEEN
Bilirubin Urine: NEGATIVE
Glucose, UA: NEGATIVE mg/dL
Leukocytes, UA: NEGATIVE
NITRITE: NEGATIVE
PROTEIN: NEGATIVE mg/dL
SPECIFIC GRAVITY, URINE: 1.012 (ref 1.005–1.030)
Squamous Epithelial / LPF: NONE SEEN
pH: 5 (ref 5.0–8.0)

## 2015-05-27 LAB — LACTIC ACID, PLASMA: Lactic Acid, Venous: 0.6 mmol/L (ref 0.5–2.0)

## 2015-05-27 MED ORDER — ZOLPIDEM TARTRATE 5 MG PO TABS
5.0000 mg | ORAL_TABLET | Freq: Every evening | ORAL | Status: DC | PRN
  Administered 2015-05-27 – 2015-05-29 (×3): 5 mg via ORAL
  Filled 2015-05-27 (×3): qty 1

## 2015-05-27 NOTE — Progress Notes (Signed)
New orders per Dr. Michela Pitcher for clear liquid diet.

## 2015-05-27 NOTE — Progress Notes (Signed)
Surgery Progress Note  S: No acute issues.  Pain improved.  + flatus O:Blood pressure 122/65, pulse 89, temperature 98.4 F (36.9 C), temperature source Oral, resp. rate 17, height  (1.778 m), weight 255 lb 3.2 oz (115.758 kg), SpO2 98 %. GEN: NAD/A&Ox3 ABD: soft, mild tender suprapubic, nondistended  A/P 39 yo admit with diverticulitis.  Doing well. - no acute surgical needs - continue IV abx - labs in am

## 2015-05-27 NOTE — Progress Notes (Signed)
[facility] - [place], [place] at [facility]   PATIENT NAME: Tanner Wells    MR#:  [id number]  DATE OF BIRTH:  [birth year]  SUBJECTIVE: Admitted for lower abdominal pain nausea, sigmoid diverticulitis. Patient seen today he says he feels little better than yesterday. Abdominal pain is mainly in the left lower quadrant. No nausea. No fever. WBC is elevated.   CHIEF COMPLAINT:   Chief Complaint  Patient presents with  . Abdominal Pain    REVIEW OF SYSTEMS:   Review of Systems  Gastrointestinal: Positive for abdominal pain. Negative for nausea.   CONSTITUTIONAL: No fever, fatigue or weakness.  EYES: No blurred or double vision.  EARS, NOSE, AND THROAT: No tinnitus or ear pain.  RESPIRATORY: No cough, shortness of breath, wheezing or hemoptysis.  CARDIOVASCULAR: No chest pain, orthopnea, edema.  GASTROINTESTINAL: No nausea, vomiting, diarrhea or abdominal pain.  GENITOURINARY: No dysuria, hematuria.  ENDOCRINE: No polyuria, nocturia,  HEMATOLOGY: No anemia, easy bruising or bleeding SKIN: No rash or lesion. MUSCULOSKELETAL: No joint pain or arthritis.   NEUROLOGIC: No tingling, numbness, weakness.  PSYCHIATRY: No anxiety or depression.   DRUG ALLERGIES:   Allergies  Allergen Reactions  . Bee Venom Swelling    VITALS:  Blood pressure 122/65, pulse 89, temperature 98.4 F (36.9 C), temperature source Oral, resp. rate 17, height 5\' 10"  (1.778 m), weight 115.758 kg (255 lb 3.2 oz), SpO2 98 %.  PHYSICAL EXAMINATION:  GENERAL:  39 y.o.-year-old patient lying in the bed with no acute distress.  EYES: Pupils equal, round, reactive to light and accommodation. No scleral icterus. Extraocular muscles intact.  HEENT: Head atraumatic, normocephalic. Oropharynx and nasopharynx clear.  NECK:  Supple, no jugular venous distention. No thyroid enlargement, no tenderness.  LUNGS: Normal breath sounds bilaterally, no wheezing, rales,rhonchi or crepitation. No use of  accessory muscles of respiration.  CARDIOVASCULAR: S1, S2 normal. No murmurs, rubs, or gallops.  ABDOMEN: Lower quadrant abdominal pain present ,no rebound tenderness. Bowel sounds are present EXTREMITIES: No pedal edema, cyanosis, or clubbing.  NEUROLOGIC: Cranial nerves II through XII are intact. Muscle strength 5/5 in all extremities. Sensation intact. Gait not checked.  PSYCHIATRIC: The patient is alert and oriented x 3.  SKIN: No obvious rash, lesion, or ulcer.    LABORATORY PANEL:   CBC  Recent Labs Lab 05/26/15 2346  WBC 16.6*  HGB 15.4  HCT 45.4  PLT 188   ------------------------------------------------------------------------------------------------------------------  Chemistries   Recent Labs Lab 05/26/15 1643 05/26/15 2346  NA 137 133*  K 3.6 3.3*  CL 102 102  CO2 25 24  GLUCOSE 91 96  BUN 8 7  CREATININE 0.84 0.80  CALCIUM 9.1 8.1*  AST 20  --   ALT 25  --   ALKPHOS 66  --   BILITOT 1.6*  --    ------------------------------------------------------------------------------------------------------------------  Cardiac Enzymes  Recent Labs Lab 05/26/15 1643  TROPONINI <0.03   ------------------------------------------------------------------------------------------------------------------  RADIOLOGY:  Ct Abdomen Pelvis W Contrast  05/26/2015   CLINICAL DATA:  Severe abdominal pain and chills  EXAM: CT ABDOMEN AND PELVIS WITH CONTRAST  TECHNIQUE: Multidetector CT imaging of the abdomen and pelvis was performed using the standard protocol following bolus administration of intravenous contrast.  CONTRAST:  OMNIPAQUE IOHEXOL 350 MG/ML SOLN  COMPARISON:  None.  FINDINGS: Lower chest:  Negative  Hepatobiliary: The liver is homogeneous without focal mass lesion. Normal liver density. Gallbladder and bile ducts normal  Pancreas: Negative  Spleen: Negative  Adrenals/Urinary Tract: Normal kidneys. No  renal mass or obstruction. No urinary tract calculi.  Urinary bladder normal.  Stomach/Bowel: Normal stomach and small bowel. Normal appendix. There is edema in the sigmoid colon with thickening of the colon and surrounding edema in the fat compatible with diverticulitis. There is a small perforated diverticulum posteriorly with a 1 cm gas collection in the fat consistent with extraluminal gas. No focal fluid collection identified. Negative for bowel obstruction. There is fluid along the fascia posterior to the sigmoid colon.  Vascular/Lymphatic: Normal aorta and vena cava.  Reproductive: Slight prostate enlargement. Minimal free fluid in the pelvis  Other: Negative for adenopathy.  Musculoskeletal: No acute bony abnormality.  IMPRESSION: Acute diverticulitis of the sigmoid colon. There is thickening of the colon with surrounding edema. There is a small perforation the posterior wall of the sigmoid colon with extraluminal gas noted. No abscess identified.   Electronically Signed   By: Marlan Palau M.D.   On: 05/26/2015 19:45    EKG:   Orders placed or performed during the hospital encounter of 05/26/15  . ED EKG  . ED EKG    ASSESSMENT AND PLAN:    1. acute diverticulitis of sigmoid colon with surrounding edema.: Patient is on ertapenem, morphine for pain control. Continue IV fluids. #2. Perforated sigmoid colon diverticulitis and continue present antibiotics therapy and see how he does. Surgery is following. Patient says he feels a little better today. May need the frequent follow-ups by surgery. #3 anxiety plan continue Klonopin/.  All the records are reviewed and case discussed with Care Management/Social Workerr. Management plans discussed with the patient, family and they are in agreement.  CODE STATUS: full  TOTAL TIME TAKING CARE OF THIS PATIENT: 35 minutes.   POSSIBLE D/C IN 1-2DAYS, DEPENDING ON CLINICAL CONDITION.   Katha Hamming M.D on 05/27/2015 at 8:56 AM  Between 7am to 6pm - Pager - [phone]  After 6pm go to  [email or web address] - password [email or web address] [facility]  [place] [facility]  Office  [phone]  CC: Primary care physician; Yancey Flemings, MD

## 2015-05-27 NOTE — Progress Notes (Signed)
Subjective:   He is feeling better. He remains afebrile. He does not have nearly the pain and he had last night. He does not have any nausea. He is passing gas regularly.  Vital signs in last 24 hours: Temp:  [98.2 F (36.8 C)-99.1 F (37.3 C)] 98.4 F (36.9 C) (09/04 1623) Pulse Rate:  [89-98] 92 (09/04 1623) Resp:  [17-21] 17 (09/04 1623) BP: (110-149)/(65-88) 110/87 mmHg (09/04 1623) SpO2:  [92 %-100 %] 99 % (09/04 1623) Weight:  [255 lb 3.2 oz (115.758 kg)] 255 lb 3.2 oz (115.758 kg) (09/03 2151)    Intake/Output from previous day: 09/03 0701 - 09/04 0700 In: 1467 [I.V.:1467] Out: 800 [Urine:800]  Exam:  His abdomen is soft nontender with minimal guarding and left lower quadrant. He has no rebound. He has good bowel sounds.  Lab Results:  CBC  Recent Labs  05/26/15 1643 05/26/15 2346  WBC 15.9* 16.6*  HGB 16.8 15.4  HCT 49.0 45.4  PLT 210 188   CMP     Component Value Date/Time   NA 133* 05/26/2015 2346   NA 136 11/20/2013 1817   K 3.3* 05/26/2015 2346   K 3.4* 11/20/2013 1817   CL 102 05/26/2015 2346   CL 103 11/20/2013 1817   CO2 24 05/26/2015 2346   CO2 27 11/20/2013 1817   GLUCOSE 96 05/26/2015 2346   GLUCOSE 86 11/20/2013 1817   BUN 7 05/26/2015 2346   BUN 7 11/20/2013 1817   CREATININE 0.80 05/26/2015 2346   CREATININE 0.88 11/20/2013 1817   CALCIUM 8.1* 05/26/2015 2346   CALCIUM 8.8 11/20/2013 1817   PROT 7.3 05/26/2015 1643   ALBUMIN 4.0 05/26/2015 1643   AST 20 05/26/2015 1643   ALT 25 05/26/2015 1643   ALKPHOS 66 05/26/2015 1643   BILITOT 1.6* 05/26/2015 1643   GFRNONAA >60 05/26/2015 2346   GFRNONAA >60 11/20/2013 1817   GFRAA >60 05/26/2015 2346   GFRAA >60 11/20/2013 1817   PT/INR No results for input(s): LABPROT, INR in the last 72 hours.  Studies/Results: Ct Abdomen Pelvis W Contrast  05/26/2015   CLINICAL DATA:  Severe abdominal pain and chills  EXAM: CT ABDOMEN AND PELVIS WITH CONTRAST  TECHNIQUE: Multidetector CT imaging of  the abdomen and pelvis was performed using the standard protocol following bolus administration of intravenous contrast.  CONTRAST:  OMNIPAQUE IOHEXOL 350 MG/ML SOLN  COMPARISON:  None.  FINDINGS: Lower chest:  Negative  Hepatobiliary: The liver is homogeneous without focal mass lesion. Normal liver density. Gallbladder and bile ducts normal  Pancreas: Negative  Spleen: Negative  Adrenals/Urinary Tract: Normal kidneys. No renal mass or obstruction. No urinary tract calculi. Urinary bladder normal.  Stomach/Bowel: Normal stomach and small bowel. Normal appendix. There is edema in the sigmoid colon with thickening of the colon and surrounding edema in the fat compatible with diverticulitis. There is a small perforated diverticulum posteriorly with a 1 cm gas collection in the fat consistent with extraluminal gas. No focal fluid collection identified. Negative for bowel obstruction. There is fluid along the fascia posterior to the sigmoid colon.  Vascular/Lymphatic: Normal aorta and vena cava.  Reproductive: Slight prostate enlargement. Minimal free fluid in the pelvis  Other: Negative for adenopathy.  Musculoskeletal: No acute bony abnormality.  IMPRESSION: Acute diverticulitis of the sigmoid colon. There is thickening of the colon with surrounding edema. There is a small perforation the posterior wall of the sigmoid colon with extraluminal gas noted. No abscess identified.   Electronically Signed  By: Marlan Palau M.D.   On: 05/26/2015 19:45    Assessment/Plan: Overall is improving. We will continue with his antibiotic therapy. We would attempt to delay surgical intervention if at all possible during this current episode. I discussed this plan with him in detail. He is in agreement.

## 2015-05-27 NOTE — Progress Notes (Signed)
Called Dr. Judithann Sheen and asked for an order for a sleeping aid to help patient sleep.  Doctor ordered  ambien qhs prn. Tanner Wells  05/27/2015 8:25 PM

## 2015-05-27 NOTE — Progress Notes (Signed)
Pt admitted from ED to Room 222. Received report from [facility] in ED.  Pt A&O x4. No signs of acute distress noted at this time. IV access intact & infusing. Medications administered as ordered (See MAR). Admission & Assessment completed.   Oriented pt to room. Education provided on call bell, telephone, IVpole/IV alarms. Education presented on white board as a pt/nurse Public relations account executive. White board completed and updated as needed.  Reviewed diet/medication orders with pt. Addressed all of the pt & family's questions.  VSS. Family at bedside and will continue to monitor.

## 2015-05-28 DIAGNOSIS — K5792 Diverticulitis of intestine, part unspecified, without perforation or abscess without bleeding: Secondary | ICD-10-CM

## 2015-05-28 LAB — CBC
HEMATOCRIT: 44.8 % (ref 40.0–52.0)
HEMOGLOBIN: 15 g/dL (ref 13.0–18.0)
MCH: 31.3 pg (ref 26.0–34.0)
MCHC: 33.4 g/dL (ref 32.0–36.0)
MCV: 93.7 fL (ref 80.0–100.0)
Platelets: 184 10*3/uL (ref 150–440)
RBC: 4.77 MIL/uL (ref 4.40–5.90)
RDW: 13.6 % (ref 11.5–14.5)
WBC: 13.9 10*3/uL — ABNORMAL HIGH (ref 3.8–10.6)

## 2015-05-28 LAB — BASIC METABOLIC PANEL
Anion gap: 8 (ref 5–15)
BUN: 6 mg/dL (ref 6–20)
CHLORIDE: 104 mmol/L (ref 101–111)
CO2: 22 mmol/L (ref 22–32)
CREATININE: 0.88 mg/dL (ref 0.61–1.24)
Calcium: 8.5 mg/dL — ABNORMAL LOW (ref 8.9–10.3)
GFR calc Af Amer: >60 mL/min (ref >60)
GFR calc non Af Amer: >60 mL/min (ref >60)
Glucose, Bld: 88 mg/dL (ref 65–99)
POTASSIUM: 3.7 mmol/L (ref 3.5–5.1)
Sodium: 134 mmol/L — ABNORMAL LOW (ref 135–145)

## 2015-05-28 MED ORDER — NICOTINE 10 MG IN INHA
1.0000 | RESPIRATORY_TRACT | Status: DC | PRN
  Administered 2015-05-28: 1 via RESPIRATORY_TRACT
  Filled 2015-05-28 (×2): qty 36

## 2015-05-28 NOTE — Progress Notes (Signed)
Initial Nutrition Assessment   INTERVENTION:   Coordination of Care: await diet order advancement as medically able Medical Food Supplement Therapy: will recommend on follow if diet order unable to be advanced Education: will educate on nutrition therapy on follow-up   NUTRITION DIAGNOSIS:   Inadequate oral intake related to acute illness as evidenced by  (CL diet order, taking sips).  GOAL:   Patient will meet greater than or equal to 90% of their needs  MONITOR:    (Energy Intake, Digestive System)  REASON FOR ASSESSMENT:   Malnutrition Screening Tool    ASSESSMENT:   Pt admitted with abdominal pain and sepsis, secondary to acute diverticulitis and dehydration.   Past Medical History  Diagnosis Date  . Diverticulitis   . Hypertension   . Anxiety   . GERD (gastroesophageal reflux disease)     Diet Order:  Diet clear liquid Room service appropriate?: Yes; Fluid consistency:: Thin    Current Nutrition: Pt reports eating jello yesterday, sips of water this am. Pt reports being anxious about taking anything po.  Food/Nutrition-Related History: Pt reports appetite and normal po intake until this past Friday. Pt reports minimal intake over the weekend.    Medications: Protonix  Electrolyte/Renal Profile and Glucose Profile:   Recent Labs Lab 05/26/15 1643 05/26/15 2346 05/28/15 0329  NA 137 133* 134*  K 3.6 3.3* 3.7  CL 102 102 104  CO2 BUN CREATININE 0.84 0.80 0.88  CALCIUM 9.1 8.1* 8.5*  GLUCOSE 91 96 88   Protein Profile:  Recent Labs Lab 05/26/15 1643  ALBUMIN 4.0    Gastrointestinal Profile: Last BM: 05/25/2015   Weight Change: Pt with weight gain per CHL. Per pt weight has been stable PTA.   Skin:  Reviewed, no issues   Height:   Ht Readings from Last 1 Encounters:  05/26/15  (1.778 m)    Weight:   Wt Readings from Last 1 Encounters:  05/26/15 255 lb 3.2 oz (115.758 kg)    Wt Readings from Last 10  Encounters:  05/26/15 255 lb 3.2 oz (115.758 kg)  05/01/09 246 lb (111.585 kg)     BMI:  Body mass index is 36.62 kg/(m^2).   EDUCATION NEEDS:   No education needs identified at this time   LOW Care Level  Leda Quail, [place], LDN Pager [phone]

## 2015-05-28 NOTE — Progress Notes (Signed)
[facility] - [place] at [facility]   PATIENT NAME: Tanner Wells    MR#:  [id number]  DATE OF BIRTH:  11/22/1975  SUBJECTIVE: Admitted for lower abdominal pain nausea, sigmoid diverticulitis. Patient seen today he says he feels little better than yesterday. Clinically improving.Marland Kitchen   CHIEF COMPLAINT:   Chief Complaint  Patient presents with  . Abdominal Pain    REVIEW OF SYSTEMS:   Review of Systems  Gastrointestinal: Positive for abdominal pain. Negative for nausea.   CONSTITUTIONAL: No fever, fatigue or weakness.  EYES: No blurred or double vision.  EARS, NOSE, AND THROAT: No tinnitus or ear pain.  RESPIRATORY: No cough, shortness of breath, wheezing or hemoptysis.  CARDIOVASCULAR: No chest pain, orthopnea, edema.  GASTROINTESTINAL: No nausea, vomiting, diarrhea or abdominal pain.  GENITOURINARY: No dysuria, hematuria.  ENDOCRINE: No polyuria, nocturia,  HEMATOLOGY: No anemia, easy bruising or bleeding SKIN: No rash or lesion. MUSCULOSKELETAL: No joint pain or arthritis.   NEUROLOGIC: No tingling, numbness, weakness.  PSYCHIATRY: No anxiety or depression.   DRUG ALLERGIES:   Allergies  Allergen Reactions  . Bee Venom Swelling    VITALS:  Blood pressure 125/79, pulse 90, temperature 98.5 F (36.9 C), temperature source Oral, resp. rate 17, height 5\' 10"  (1.778 m), weight 115.758 kg (255 lb 3.2 oz), SpO2 98 %.  PHYSICAL EXAMINATION:  GENERAL:  39 y.o.-year-old patient lying in the bed with no acute distress.  EYES: Pupils equal, round, reactive to light and accommodation. No scleral icterus. Extraocular muscles intact.  HEENT: Head atraumatic, normocephalic. Oropharynx and nasopharynx clear.  NECK:  Supple, no jugular venous distention. No thyroid enlargement, no tenderness.  LUNGS: Normal breath sounds bilaterally, no wheezing, rales,rhonchi or crepitation. No use of accessory muscles of respiration.  CARDIOVASCULAR: S1, S2 normal. No  murmurs, rubs, or gallops.  ABDOMEN: Lower quadrant abdominal pain present ,no rebound tenderness. Bowel sounds are present EXTREMITIES: No pedal edema, cyanosis, or clubbing.  NEUROLOGIC: Cranial nerves II through XII are intact. Muscle strength 5/5 in all extremities. Sensation intact. Gait not checked.  PSYCHIATRIC: The patient is alert and oriented x 3.  SKIN: No obvious rash, lesion, or ulcer.    LABORATORY PANEL:   CBC  Recent Labs Lab 05/28/15 0329  WBC 13.9*  HGB 15.0  HCT 44.8  PLT 184   ------------------------------------------------------------------------------------------------------------------  Chemistries   Recent Labs Lab 05/26/15 1643  05/28/15 0329  NA 137  < > 134*  K 3.6  < > 3.7  CL 102  < > 104  CO2 25  < > 22  GLUCOSE 91  < > 88  BUN 8  < > 6  CREATININE 0.84  < > 0.88  CALCIUM 9.1  < > 8.5*  AST 20  --   --   ALT 25  --   --   ALKPHOS 66  --   --   BILITOT 1.6*  --   --   < > = values in this interval not displayed. ------------------------------------------------------------------------------------------------------------------  Cardiac Enzymes  Recent Labs Lab 05/26/15 1643  TROPONINI <0.03   ------------------------------------------------------------------------------------------------------------------  RADIOLOGY:  Ct Abdomen Pelvis W Contrast  05/26/2015   CLINICAL DATA:  Severe abdominal pain and chills  EXAM: CT ABDOMEN AND PELVIS WITH CONTRAST  TECHNIQUE: Multidetector CT imaging of the abdomen and pelvis was performed using the standard protocol following bolus administration of intravenous contrast.  CONTRAST:  OMNIPAQUE IOHEXOL 350 MG/ML SOLN  COMPARISON:  None.  FINDINGS: Lower chest:  Negative  Hepatobiliary: The liver is homogeneous without focal mass lesion. Normal liver density. Gallbladder and bile ducts normal  Pancreas: Negative  Spleen: Negative  Adrenals/Urinary Tract: Normal kidneys. No renal mass or  obstruction. No urinary tract calculi. Urinary bladder normal.  Stomach/Bowel: Normal stomach and small bowel. Normal appendix. There is edema in the sigmoid colon with thickening of the colon and surrounding edema in the fat compatible with diverticulitis. There is a small perforated diverticulum posteriorly with a 1 cm gas collection in the fat consistent with extraluminal gas. No focal fluid collection identified. Negative for bowel obstruction. There is fluid along the fascia posterior to the sigmoid colon.  Vascular/Lymphatic: Normal aorta and vena cava.  Reproductive: Slight prostate enlargement. Minimal free fluid in the pelvis  Other: Negative for adenopathy.  Musculoskeletal: No acute bony abnormality.  IMPRESSION: Acute diverticulitis of the sigmoid colon. There is thickening of the colon with surrounding edema. There is a small perforation the posterior wall of the sigmoid colon with extraluminal gas noted. No abscess identified.   Electronically Signed   By: Marlan Palau M.D.   On: 05/26/2015 19:45    EKG:   Orders placed or performed during the hospital encounter of 05/26/15  . ED EKG  . ED EKG    ASSESSMENT AND PLAN:    1. acute diverticulitis of sigmoid colon with surrounding edema.: Patient is on ertapenem, morphine for pain control.clinically improving..continue clearliquids. #2. Perforated sigmoid colon diverticulitis and continue present antibiotics therapy and see how he does. Surgery is following. Patient says he feels a little better today. #3 anxiety plan continue Klonopin/line   Likely discharge tomorrow with by mouth antibiotics if he is asymptomatic. . All the records are reviewed and case discussed with Care Management/Social Workerr. Management plans discussed with the patient, family and they are in agreement.  CODE STATUS: full  TOTAL TIME TAKING CARE OF THIS PATIENT: 35 minutes.   POSSIBLE D/C IN 1-2DAYS, DEPENDING ON CLINICAL  CONDITION.   Katha Hamming M.D on 05/28/2015 at 10:55 AM  Between 7am to 6pm - Pager - [phone]  After 6pm go to [email or web address] - password [email or web address] [facility]  [place] [facility]  Office  [phone]  CC: Primary care physician; Yancey Flemings, MD

## 2015-05-28 NOTE — Progress Notes (Signed)
Notified Dr Luberta Mutter of pt request for nicotine cartridge inhaler; Dr acknowledged, orders received

## 2015-05-28 NOTE — Progress Notes (Signed)
Patient ID: Tanner Wells, male   DOB: 05-10-1976, 39 y.o.   MRN: [id number]   [facility]   PATIENT NAME: Tanner Wells    MR#:  [id number]  DATE OF BIRTH:  03/16/1976  SUBJECTIVE:   He is continuing to have some pain in the left lower quadrant. He is tolerating clear liquid diet. He is somewhat hesitant to advance to regular diet at this point. He's had no bowel movement. Overall he feels he is much better compared to admission. He is passing some flatus but no bowel movement.  REVIEW OF SYSTEMS:   Review of Systems  Constitutional: Negative.   Eyes: Negative.   Respiratory: Negative for cough.   Cardiovascular: Negative for chest pain.  Gastrointestinal: Positive for abdominal pain. Negative for heartburn, nausea, vomiting and constipation.    DRUG ALLERGIES:   Allergies  Allergen Reactions  . Bee Venom Swelling    VITALS:  Blood pressure 125/79, pulse 90, temperature 98.5 F (36.9 C), temperature source Oral, resp. rate 17, height  (1.778 m), weight 255 lb 3.2 oz (115.758 kg), SpO2 98 %.  PHYSICAL EXAMINATION:  Physical Exam  Constitutional: He is oriented to person, place, and time and well-developed, well-nourished, and in no distress. No distress.  HENT:  Head: Normocephalic and atraumatic.  Eyes: Pupils are equal, round, and reactive to light.  Cardiovascular: Normal rate and regular rhythm.   Pulmonary/Chest: Effort normal. No respiratory distress.  Abdominal: Soft. He exhibits no distension and no mass. There is tenderness. There is no rebound and no guarding.  Neurological: He is oriented to person, place, and time.  Skin: Skin is dry. He is not diaphoretic.  Psychiatric: Mood, memory, affect and judgment normal.      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  I would continue antibiotics and clear liquid diet. Once he is able to drop tolerating a  regular diet and is nearly pain-free can be discharged. He will need to follow up in our office as an outpatient. He needs to consider elective surgical resection. The patient is in agreement with this plan. We will follow with you. No surgical indications at this time exist.

## 2015-05-29 NOTE — Progress Notes (Signed)
Patient ID: Tanner Wells, male   DOB: 1975/11/08, 39 y.o.   MRN: [id number]   [facility]   PATIENT NAME: Tanner Wells    MR#:  [id number]  DATE OF BIRTH:  1976-02-25  SUBJECTIVE:   Feels better, passing some flatus, no BM, much less pain than yesterday  REVIEW OF SYSTEMS:   Review of Systems  Constitutional: Negative for fever and chills.  HENT: Negative.   Eyes: Negative.   Cardiovascular: Negative.   Gastrointestinal: Positive for abdominal pain. Negative for heartburn, nausea, vomiting and diarrhea.  All other systems reviewed and are negative.   DRUG ALLERGIES:   Allergies  Allergen Reactions  . Bee Venom Swelling    VITALS:  Blood pressure 136/80, pulse 72, temperature 98.2 F (36.8 C), temperature source Oral, resp. rate 18, height  (1.778 m), weight 255 lb 3.2 oz (115.758 kg), SpO2 97 %.  PHYSICAL EXAMINATION:   Physical Exam  Constitutional: He is well-developed, well-nourished, and in no distress. No distress.  HENT:  Head: Normocephalic and atraumatic.  Cardiovascular: Normal rate.   Pulmonary/Chest: Effort normal.  Abdominal: Soft. He exhibits no distension and no mass. There is no tenderness. There is no rebound and no guarding.  Musculoskeletal: He exhibits no edema or tenderness.  Skin: Skin is warm. He is not diaphoretic.  Psychiatric: Mood, affect and judgment normal.      ASSESSMENT AND PLAN:   Complicated microperforation sigmoid diverticulitis.  Continue intravenous antibiotics. I suspect patient be advanced to a soft diet low fiber discharged home on oral antibiotics with close interval follow-up in our office next 7-10 days. Consideration should be given to the patient regarding elective sigmoid colectomy.

## 2015-05-29 NOTE — Progress Notes (Addendum)
ANTIBIOTIC CONSULT NOTE - Follow up  Pharmacy Consult for meropenem Indication: IAI  Allergies  Allergen Reactions  . Bee Venom Swelling    Patient Measurements: Height:  (177.8 cm) Weight: 255 lb 3.2 oz (115.758 kg) IBW/kg (Calculated) : 73 Adjusted Body Weight: 90 kg  Vital Signs: Temp: 98.2 F (36.8 C) (09/06 0827) Temp Source: Oral (09/06 0827) BP: 136/80 mmHg (09/06 0827) Pulse Rate: 72 (09/06 0827) Intake/Output from previous day: 09/05 0701 - 09/06 0700 In: 935 [P.O.:735; IV Piggyback:200] Out: 1050 [Urine:1050] Intake/Output from this shift: Total I/O In: 334 [P.O.:240; IV Piggyback:94] Out: -   Labs:  Recent Labs  05/26/15 1643 05/26/15 2346 05/28/15 0329  WBC 15.9* 16.6* 13.9*  HGB 16.8 15.4 15.0  PLT 210 188 184  CREATININE 0.84 0.80 0.88   Estimated Creatinine Clearance: 145 mL/min (by C-G formula based on Cr of 0.88). No results for input(s): VANCOTROUGH, VANCOPEAK, VANCORANDOM, GENTTROUGH, GENTPEAK, GENTRANDOM, TOBRATROUGH, TOBRAPEAK, TOBRARND, AMIKACINPEAK, AMIKACINTROU, AMIKACIN in the last 72 hours.   Microbiology: Recent Results (from the past 720 hour(s))  Culture, blood (routine x 2)     Status: None (Preliminary result)   Collection Time: 05/26/15 11:46 PM  Result Value Ref Range Status   Specimen Description BLOOD RIGHT HAND  Final   Special Requests BOTTLES DRAWN AEROBIC AND ANAEROBIC 2CC  Final   Culture NO GROWTH 2 DAYS  Final   Report Status PENDING  Incomplete  Culture, blood (routine x 2)     Status: None (Preliminary result)   Collection Time: 05/26/15 11:46 PM  Result Value Ref Range Status   Specimen Description BLOOD LEFT HAND  Final   Special Requests BOTTLES DRAWN AEROBIC AND ANAEROBIC 2CC  Final   Culture NO GROWTH 2 DAYS  Final   Report Status PENDING  Incomplete    Medical History: Past Medical History  Diagnosis Date  . Diverticulitis   . Hypertension   . Anxiety   . GERD (gastroesophageal reflux  disease)        Assessment: 39 yom cc severe abdominal pain with history of diverticulitis and radiologic evidence on CT of apparent perforated diverticulitis in sigmoid area starting empirically on meropenem.  Goal of Therapy:  Resolve infection  Plan:  Continue Meropenem 1 gm IV Q8H  Wyllow Seigler, Daisy Floro, Pharm.D. Clinical Pharmacist 05/29/2015,1:56 PM

## 2015-05-30 MED ORDER — LEVOFLOXACIN 500 MG PO TABS: 500.0000 mg | ORAL_TABLET | Freq: Every day | ORAL | Status: DC

## 2015-05-30 MED ORDER — METRONIDAZOLE 500 MG PO TABS: 500.0000 mg | ORAL_TABLET | Freq: Three times a day (TID) | ORAL | Status: DC

## 2015-05-30 NOTE — Progress Notes (Signed)
Pt. Educated about prescription medications, signs and symptoms of when to call physician, and follow up appointment. Patients concerns answered regarding follow up with physician and plan of care.Pt. Verbalizes understanding. Discharged to home with family.

## 2015-05-30 NOTE — Discharge Instructions (Signed)
Follow with Primary MD Yancey Flemings, MD in 7 days   Get CBC, CMP, 2 view Chest X ray checked  by Primary MD next visit.    Activity: As tolerated  Disposition Home **   Diet:;soft diet ,low fiber for 3-4 days ,if tolerates then regular diet  For Heart failure patients - Check your Weight same time everyday, if you gain over 2 pounds, or you develop in leg swelling, experience more shortness of breath or chest pain, call your Primary MD immediately. Follow Cardiac Low Salt Diet and 1.5 lit/day fluid restriction.   On your next visit with your primary care physician please Get Medicines reviewed and adjusted.   Please request your Prim.MD to go over all Hospital Tests and Procedure/Radiological results at the follow up, please get all Hospital records sent to your Prim MD by signing hospital release before you go home.   If you experience worsening of your admission symptoms, develop shortness of breath, life threatening emergency, suicidal or homicidal thoughts you must seek medical attention immediately by calling 911 or calling your MD immediately  if symptoms less severe.  You Must read complete instructions/literature along with all the possible adverse reactions/side effects for all the Medicines you take and that have been prescribed to you. Take any new Medicines after you have completely understood and accpet all the possible adverse reactions/side effects.   Do not drive, operating heavy machinery, perform activities at heights, swimming or participation in water activities or provide baby sitting services if your were admitted for syncope or siezures until you have seen by Primary MD or a Neurologist and advised to do so again.  Do not drive when taking Pain medications.    Do not take more than prescribed Pain, Sleep and Anxiety Medications  Special Instructions: If you have smoked or chewed Tobacco  in the last 2 yrs please stop smoking, stop any regular Alcohol  and or any  Recreational drug use.  Wear Seat belts while driving.   Please note  You were cared for by a hospitalist during your hospital stay. If you have any questions about your discharge medications or the care you received while you were in the hospital after you are discharged, you can call the unit and asked to speak with the hospitalist on call if the hospitalist that took care of you is not available. Once you are discharged, your primary care physician will handle any further medical issues. Please note that NO REFILLS for any discharge medications will be authorized once you are discharged, as it is imperative that you return to your primary care physician (or establish a relationship with a primary care physician if you do not have one) for your aftercare needs so that they can reassess your need for medications and monitor your lab values.

## 2015-06-01 LAB — CULTURE, BLOOD (ROUTINE X 2)
CULTURE: NO GROWTH
Culture: NO GROWTH

## 2015-06-01 NOTE — Discharge Summary (Signed)
Tanner Wells, is a 39 y.o. male  DOB April 17, 1976  MRN [id number].  Admission date:  05/26/2015  Admitting Physician  Oralia Manis, MD  Discharge Date:  05/30/2015   Primary MD  Yancey Flemings, MD  Recommendations for primary care physician for things to follow:   Follow-up with Dr.Bird inabout a week to 10 days for the diverticulitis   Admission Diagnosis  Diverticulitis of large intestine with perforation without bleeding [K57.20]   Discharge Diagnosis  Diverticulitis of large intestine with perforation without bleeding [K57.20]    Principal Problem:   Sepsis Active Problems:   Acute diverticulitis   Intestinal perforation   Benign essential HTN   Anxiety   Dehydration   GERD (gastroesophageal reflux disease)      Past Medical History  Diagnosis Date  . Diverticulitis   . Hypertension   . Anxiety   . GERD (gastroesophageal reflux disease)     Past Surgical History  Procedure Laterality Date  . Colonoscopy         History of present illness and  Hospital Course:     Kindly see H&P for history of present illness and admission details, please review complete Labs, Consult reports and Test reports for all details in brief  HPI  from the history and physical done on the day of admission 39 year old male patient admitted secondary to left lower quadrant abdominal pain, nausea. Patient has a history of diverticulitis before. Had significant chills at home. Admitted for sepsis secondary to acute diverticulitis.   Hospital Course  #1 sepsis present on admission. Patient is a was admitted to medical service, continue nothing by mouth, IV fluids were started. Patient was given IV meropenem. Blood cultures were negative. Will be seeing improved from 16.6-13.9. He was afebrile. Initially he was nothing by  mouth but tolerated clear liquids and advanced him to soft diet. Discharge him home, patient does have acute diverticulitis and he is advised to follow up with Dr. Egbert Garibaldi  as an outpatient for evaluation of that.discharged with Levaquin, Flagyl to finish  course of treatment for diverticulitis.  #2 microperforation of sigmoid diverticula: seen by  surgery Dr. Michela Pitcher, Dr. Egbert Garibaldi, Patient abdominal pain resolved with conservative management with IV antibiotics. As per surgery note  Did not have any specific abscess.will plan antibiotic therapy and follow-up over the next couple of days. He will need surgery at some point to resect this involved segment of sigmoid. If we can delayed several weeks to months when he likely can avoid a temporary colostomy. The patient and his family in agreement with this plan.    Discharge Condition: stable   Follow UP  Follow-up Information    Follow up with Dr. Michela Pitcher. Go on 06/13/2015.   Why:  Wednesday at 2:30pm hospital follow-up.   Contact information:   [address] [place] [address] [phone]        Discharge Instructions  and  Discharge Medications       Medication List    STOP taking these medications        doxycycline 100 MG capsule  Commonly known as:  MONODOX      TAKE these medications        AMITIZA 24 MCG capsule  Generic drug:  lubiprostone  Take 24 mcg by mouth daily.     Benzoyl Peroxide 10 % Crea  Apply 1 application topically at bedtime as needed.     clonazePAM 1 MG tablet  Commonly known as:  Scarlette Calico  Take 0.5 mg by mouth 2 (two) times daily.     DEXILANT 60 MG capsule  Generic drug:  dexlansoprazole  Take 60 mg by mouth daily.     levofloxacin 500 MG tablet  Commonly known as:  LEVAQUIN  Take 1 tablet (500 mg total) by mouth daily.     metroNIDAZOLE 500 MG tablet  Commonly known as:  FLAGYL  Take 1 tablet (500 mg total) by mouth 3 (three) times daily.          Diet and Activity recommendation: See  Discharge Instructions above   Consults obtained - surgery   Major procedures and Radiology Reports - PLEASE review detailed and final reports for all details, in brief -      Ct Abdomen Pelvis W Contrast  05/26/2015   CLINICAL DATA:  Severe abdominal pain and chills  EXAM: CT ABDOMEN AND PELVIS WITH CONTRAST  TECHNIQUE: Multidetector CT imaging of the abdomen and pelvis was performed using the standard protocol following bolus administration of intravenous contrast.  CONTRAST:  OMNIPAQUE IOHEXOL 350 MG/ML SOLN  COMPARISON:  None.  FINDINGS: Lower chest:  Negative  Hepatobiliary: The liver is homogeneous without focal mass lesion. Normal liver density. Gallbladder and bile ducts normal  Pancreas: Negative  Spleen: Negative  Adrenals/Urinary Tract: Normal kidneys. No renal mass or obstruction. No urinary tract calculi. Urinary bladder normal.  Stomach/Bowel: Normal stomach and small bowel. Normal appendix. There is edema in the sigmoid colon with thickening of the colon and surrounding edema in the fat compatible with diverticulitis. There is a small perforated diverticulum posteriorly with a 1 cm gas collection in the fat consistent with extraluminal gas. No focal fluid collection identified. Negative for bowel obstruction. There is fluid along the fascia posterior to the sigmoid colon.  Vascular/Lymphatic: Normal aorta and vena cava.  Reproductive: Slight prostate enlargement. Minimal free fluid in the pelvis  Other: Negative for adenopathy.  Musculoskeletal: No acute bony abnormality.  IMPRESSION: Acute diverticulitis of the sigmoid colon. There is thickening of the colon with surrounding edema. There is a small perforation the posterior wall of the sigmoid colon with extraluminal gas noted. No abscess identified.   Electronically Signed   By: Marlan Palau M.D.   On: 05/26/2015 19:45    Micro Results     Recent Results (from the past 240 hour(s))  Culture, blood (routine x 2)     Status:  None (Preliminary result)   Collection Time: 05/26/15 11:46 PM  Result Value Ref Range Status   Specimen Description BLOOD RIGHT HAND  Final   Special Requests BOTTLES DRAWN AEROBIC AND ANAEROBIC 2CC  Final   Culture NO GROWTH 4 DAYS  Final   Report Status PENDING  Incomplete  Culture, blood (routine x 2)     Status: None (Preliminary result)   Collection Time: 05/26/15 11:46 PM  Result Value Ref Range Status   Specimen Description BLOOD LEFT HAND  Final   Special Requests BOTTLES DRAWN AEROBIC AND ANAEROBIC 2CC  Final   Culture NO GROWTH 4 DAYS  Final   Report Status PENDING  Incomplete       Today   Subjective:   Quintus Premo today has no headache,no chest abdominal pain,no new weakness tingling or numbness, feels much better wants to go home today.  Objective:   Blood pressure 126/76, pulse 64, temperature 97.9 F (36.6 C), temperature source Oral, resp. rate 17, height  (1.778 m), weight 115.758 kg (255 lb 3.2 oz), SpO2 98 %.  No intake or output data in the 24 hours ending 06/01/15 1240  Exam Awake Alert, Oriented x 3, No new F.N deficits, Normal affect [place].AT,PERRAL Supple Neck,No JVD, No cervical lymphadenopathy appriciated.  Symmetrical Chest wall movement, Good air movement bilaterally, CTAB RRR,No Gallops,Rubs or new Murmurs, No Parasternal Heave +ve B.Sounds, Abd Soft, Non tender, No organomegaly appriciated, No rebound -guarding or rigidity. No Cyanosis, Clubbing or edema, No new Rash or bruise  Data Review   CBC w Diff:  Lab Results  Component Value Date   WBC 13.9* 05/28/2015   WBC 14.2* 11/20/2013   HGB 15.0 05/28/2015   HGB 17.4 11/20/2013   HCT 44.8 05/28/2015   HCT 52.0 11/20/2013   PLT 184 05/28/2015   PLT 220 11/20/2013   LYMPHOPCT 9 05/26/2015   LYMPHOPCT 17.1 11/20/2013   MONOPCT 12 05/26/2015   MONOPCT 9.7 11/20/2013   EOSPCT 0 05/26/2015   EOSPCT 0.3 11/20/2013   BASOPCT 0 05/26/2015   BASOPCT 0.7 11/20/2013    CMP:   Lab Results  Component Value Date   NA 134* 05/28/2015   NA 136 11/20/2013   K 3.7 05/28/2015   K 3.4* 11/20/2013   CL 104 05/28/2015   CL 103 11/20/2013   CO2 22 05/28/2015   CO2 27 11/20/2013   BUN 6 05/28/2015   BUN 7 11/20/2013   CREATININE 0.88 05/28/2015   CREATININE 0.88 11/20/2013   PROT 7.3 05/26/2015   ALBUMIN 4.0 05/26/2015   BILITOT 1.6* 05/26/2015   ALKPHOS 66 05/26/2015   AST 20 05/26/2015   ALT 25 05/26/2015  .   Total Time in preparing paper work, data evaluation and todays exam - 35 minutes  Shelsy Seng M.D on 05/30/2015 at 12:40 PM

## 2015-06-01 NOTE — Progress Notes (Signed)
[facility] - [place] at [facility]   PATIENT NAME: Tanner Wells    MR#:  [id number]  DATE OF BIRTH:  06-20-1976  .   CHIEF COMPLAINT:  Chief Complaint  Patient presents with  . Abdominal Pain   Seen today,has less abdominal pain. REVIEW OF SYSTEMS:   Review of Systems  Gastrointestinal: Positive for abdominal pain. Negative for nausea.   CONSTITUTIONAL: No fever, fatigue or weakness.  EYES: No blurred or double vision.  EARS, NOSE, AND THROAT: No tinnitus or ear pain.  RESPIRATORY: No cough, shortness of breath, wheezing or hemoptysis.  CARDIOVASCULAR: No chest pain, orthopnea, edema.  GASTROINTESTINAL: No nausea, vomiting, diarrhea or abdominal pain.  GENITOURINARY: No dysuria, hematuria.  ENDOCRINE: No polyuria, nocturia,  HEMATOLOGY: No anemia, easy bruising or bleeding SKIN: No rash or lesion. MUSCULOSKELETAL: No joint pain or arthritis.   NEUROLOGIC: No tingling, numbness, weakness.  PSYCHIATRY: No anxiety or depression.   DRUG ALLERGIES:   Allergies  Allergen Reactions  . Bee Venom Swelling    VITALS:  Blood pressure 126/76, pulse 64, temperature 97.9 F (36.6 C), temperature source Oral, resp. rate 17, height 5\' 10"  (1.778 m), weight 115.758 kg (255 lb 3.2 oz), SpO2 98 %.  PHYSICAL EXAMINATION:  GENERAL:  39 y.o.-year-old patient lying in the bed with no acute distress.  EYES: Pupils equal, round, reactive to light and accommodation. No scleral icterus. Extraocular muscles intact.  HEENT: Head atraumatic, normocephalic. Oropharynx and nasopharynx clear.  NECK:  Supple, no jugular venous distention. No thyroid enlargement, no tenderness.  LUNGS: Normal breath sounds bilaterally, no wheezing, rales,rhonchi or crepitation. No use of accessory muscles of respiration.  CARDIOVASCULAR: S1, S2 normal. No murmurs, rubs, or gallops.  ABDOMEN: Lower quadrant abdominal pain present ,no rebound tenderness. Bowel sounds are  present EXTREMITIES: No pedal edema, cyanosis, or clubbing.  NEUROLOGIC: Cranial nerves II through XII are intact. Muscle strength 5/5 in all extremities. Sensation intact. Gait not checked.  PSYCHIATRIC: The patient is alert and oriented x 3.  SKIN: No obvious rash, lesion, or ulcer.    LABORATORY PANEL:   CBC  Recent Labs Lab 05/28/15 0329  WBC 13.9*  HGB 15.0  HCT 44.8  PLT 184   ------------------------------------------------------------------------------------------------------------------  Chemistries   Recent Labs Lab 05/26/15 1643  05/28/15 0329  NA 137  < > 134*  K 3.6  < > 3.7  CL 102  < > 104  CO2 25  < > 22  GLUCOSE 91  < > 88  BUN 8  < > 6  CREATININE 0.84  < > 0.88  CALCIUM 9.1  < > 8.5*  AST 20  --   --   ALT 25  --   --   ALKPHOS 66  --   --   BILITOT 1.6*  --   --   < > = values in this interval not displayed. ------------------------------------------------------------------------------------------------------------------  Cardiac Enzymes  Recent Labs Lab 05/26/15 1643  TROPONINI <0.03   ------------------------------------------------------------------------------------------------------------------  RADIOLOGY:  No results found.  EKG:   Orders placed or performed during the hospital encounter of 05/26/15  . ED EKG  . ED EKG  . EKG    ASSESSMENT AND PLAN:    1. acute diverticulitis of sigmoid colon with surrounding edema.: Patient is on ertapenem, morphine for pain control.clinically improving..continue clear liquids.hesitant to advance to full liquids or soft diet yet. #2. Perforated sigmoid colon diverticulitis and continue present antibiotics therapy and see how he doesfollow up with  surgery as an out  Pt.#3 anxiety plan continue Klonopin/line   Likely discharge tomorrow with by mouth antibiotics if he is asymptomatic. . All the records are reviewed and case discussed with Care Management/Social Workerr. Management plans  discussed with the patient, family and they are in agreement.  CODE STATUS: full  TOTAL TIME TAKING CARE OF THIS PATIENT: 35 minutes.   POSSIBLE D/C IN 1-2DAYS, DEPENDING ON CLINICAL CONDITION.   Katha Hamming M.D on 06/01/2015 at 12:37 PM  Between 7am to 6pm - Pager - [phone]  After 6pm go to [email or web address] - password [email or web address]  [facility]  Office  [phone]  CC: Primary care physician; Yancey Flemings, MD

## 2015-06-13 ENCOUNTER — Encounter: Payer: Self-pay | Admitting: Surgery

## 2015-06-13 ENCOUNTER — Ambulatory Visit (INDEPENDENT_AMBULATORY_CARE_PROVIDER_SITE_OTHER): Payer: 59 | Admitting: Surgery

## 2015-06-13 VITALS — BP 137/94 | HR 87 | Temp 98.1°F | Ht 70.0 in | Wt 251.0 lb

## 2015-06-13 DIAGNOSIS — K5732 Diverticulitis of large intestine without perforation or abscess without bleeding: Secondary | ICD-10-CM

## 2015-06-13 NOTE — Patient Instructions (Signed)
We will need to follow-up with a CT scan of your abdomen to ensure that this is completely resolved.   We will call you with your details of this appointment.  We will have you follow-up in the office after the CT scan.

## 2015-06-13 NOTE — Progress Notes (Signed)
Outpatient Surgical Follow Up  06/13/2015  Tanner Wells is an 39 y.o. male.   Chief Complaint  Patient presents with  . Diverticulitis    HPI: He returns following his episode of perforated diverticulitis. He had an uncomplicated hospital course is completed his antibiotic dosage. He's feeling better with no significant pain at the present time. He had no nausea or vomiting. He's had no fever. He's return to normal activity.  Past Medical History  Diagnosis Date  . Diverticulitis   . Hypertension   . Anxiety   . GERD (gastroesophageal reflux disease)   . Hydradenitis     Past Surgical History  Procedure Laterality Date  . Colonoscopy      Family History  Problem Relation Age of Onset  . Hypertension Father   . Lung cancer Maternal Uncle   . Lung cancer Maternal Aunt   . Diabetes Mother     Social History:  reports that he has been smoking Cigarettes.  He has been smoking about 1.00 pack per day. He uses smokeless tobacco. He reports that he drinks about 3.6 oz of alcohol per week. He reports that he uses illicit drugs (Marijuana).  Allergies:  Allergies  Allergen Reactions  . Bee Venom Swelling    Medications reviewed.    Review of Systems  Constitutional: Positive for fever.  Eyes: Negative.   Respiratory: Negative.   Cardiovascular: Negative.   Gastrointestinal: Negative.   Genitourinary: Negative.   Musculoskeletal: Negative.   Skin: Negative.   Neurological: Negative.   Psychiatric/Behavioral: Negative.       BP 137/94 mmHg  Pulse 87  Temp(Src) 98.1 F (36.7 C) (Oral)  Ht  (1.778 m)  Wt 113.853 kg (251 lb)  BMI 36.01 kg/m2  Physical Exam's abdomen is soft nontender with no organomegaly. He is active bowel sounds. He denies any abdominal discomfort any type.     No results found for this or any previous visit (from the past 48 hour(s)). No results found.  Assessment/Plan:  1. Diverticulitis of colon He slowly get a  repeat CT scan to see his improvement. We talked with him about potential options for surgical intervention. We'll see him back after his CT scan. - CT Abdomen Pelvis W Contrast; Future     Tiney Rouge III  06/13/2015,negative

## 2015-06-14 ENCOUNTER — Telehealth: Payer: Self-pay

## 2015-06-14 NOTE — Telephone Encounter (Signed)
Scheduled CT for 9/27 at 0800 at [facility] location; arrive at [facility]. NPO after Midnight prior, please pick up prep kit.  All information reviewed with patient over the phone. Verbalizes understanding. Will see in office to review CT on 10/4.

## 2015-06-18 ENCOUNTER — Ambulatory Visit: Payer: 59 | Admitting: Surgery

## 2015-06-18 ENCOUNTER — Telehealth: Payer: Self-pay

## 2015-06-18 ENCOUNTER — Telehealth (HOSPITAL_COMMUNITY): Payer: Self-pay | Admitting: *Deleted

## 2015-06-18 NOTE — Telephone Encounter (Signed)
I called patient to reschedule his ct due to No Auth per April pait.  Pt stated to cancel until it was authorized. I advised him to call Dr. Michela Pitcher office

## 2015-06-18 NOTE — Telephone Encounter (Signed)
Patient called to let us know that his CT Scan was cancelled due to not having it approved. I told him that I would call his insurance company to obtain Prior-authorization. I also told him that once it was taken care of that I would call him. Patient understood. Called patient's insurance company [phone] and was able to obtain approval for his CT Scan.  Approval was given by: Audrie Gallus, RN  Approval # [id number] from 06/18/2015-08/02/2015  I then called central scheduling and spoke with Clydie Braun to make sure patient continued with that appointment. Clydie Braun called CT Department to check and they were able to put him back on the schedule for tomorrow 06/19/2015 at 8:00 AM at the [place] location. I verbally gave Clydie Braun the approval code for this CT Scan.  Called patient to let him know that everything was taken care of and to go to his CT Scan appointment tomorrow at 8:00 AM off [address] location but to arrive at 7:45 AM and he agreed.

## 2015-06-19 ENCOUNTER — Ambulatory Visit: Admission: RE | Admit: 2015-06-19 | Payer: 59 | Source: Ambulatory Visit

## 2015-06-19 ENCOUNTER — Ambulatory Visit
Admission: RE | Admit: 2015-06-19 | Discharge: 2015-06-19 | Disposition: A | Discharge status: Home / Self Care | Payer: 59 | Source: Ambulatory Visit | Attending: Surgery | Admitting: Surgery

## 2015-06-19 DIAGNOSIS — K5732 Diverticulitis of large intestine without perforation or abscess without bleeding: Secondary | ICD-10-CM | POA: Diagnosis present

## 2015-06-19 MED ORDER — IOHEXOL 350 MG/ML SOLN
100.0000 mL | Freq: Once | INTRAVENOUS | Status: AC | PRN
  Administered 2015-06-19: 100 mL via INTRAVENOUS

## 2015-06-21 ENCOUNTER — Telehealth: Payer: Self-pay | Admitting: Surgery

## 2015-06-21 NOTE — Telephone Encounter (Signed)
Please make sure patient is called when results from CT come in

## 2015-06-21 NOTE — Telephone Encounter (Signed)
Returned phone call to patient. CT was reviewed over the phone with patient. Answered all questions to patient's satisfaction. Explained that patient needs to keep follow-up on 10/4 with Dr. Orvis Brill to formally go over CT and also to meet her as she will continue care from Dr. Michela Pitcher.  Pt explains that he will be at 10/4 appointment.  Encouraged to call back with any further questions or concerns.

## 2015-06-26 ENCOUNTER — Other Ambulatory Visit: Payer: Self-pay | Admitting: *Deleted

## 2015-06-26 ENCOUNTER — Ambulatory Visit (INDEPENDENT_AMBULATORY_CARE_PROVIDER_SITE_OTHER): Payer: 59 | Admitting: Surgery

## 2015-06-26 ENCOUNTER — Encounter: Payer: Self-pay | Admitting: Surgery

## 2015-06-26 VITALS — BP 137/84 | HR 86 | Temp 98.0°F | Ht 70.0 in | Wt 247.0 lb

## 2015-06-26 DIAGNOSIS — K5732 Diverticulitis of large intestine without perforation or abscess without bleeding: Secondary | ICD-10-CM | POA: Diagnosis not present

## 2015-06-26 NOTE — Patient Instructions (Signed)
Please use a fiber supplement of your choice.   Make sure you are drinking at least 8-12 glasses of water each day (64-96 oz.) This is extremely important!!!  If you have tried the Fiber and water and you are still having Chronic Constipation, please call our office and we will get you in with Dr. Orvis Brill.  We will see you back in the office after your Colonoscopy.  Please call if you are having any symptoms of your diverticulitis at any time or ANY questions or concerns.

## 2015-06-26 NOTE — Progress Notes (Signed)
Subjective:     Patient ID: Tanner Wells, male   DOB: 1976/02/27, 39 y.o.   MRN: [id number]  HPI  39 yr old with history of chronic constipation and diverticulitis flair with perforation approximately 4 weeks prior.  He has been improving but has still been having difficulty with having regular bowel movements.  He has even been trying fiber supplements and miralax but feels they do not work.     Review of Systems  Constitutional: Negative for fever, chills, activity change and appetite change.  Gastrointestinal: Positive for abdominal pain and constipation. Negative for nausea, vomiting, diarrhea, blood in stool and abdominal distention.  Skin: Positive for wound.  All other systems reviewed and are negative.  Filed Vitals:   06/26/15 0915  BP: 137/84  Pulse: 86  Temp: 98 F (36.7 C)       Objective:   Physical Exam  Constitutional: He is oriented to person, place, and time. He appears well-developed and well-nourished. No distress.  Abdominal: Soft. He exhibits no distension. There is no tenderness.  Neurological: He is alert and oriented to person, place, and time.  Skin: Skin is warm and dry. No rash noted. No erythema.  Scar in left axilla from previous hydradenitis incision  Psychiatric: He has a normal mood and affect. His behavior is normal. Thought content normal.  Vitals reviewed.      Assessment:     39 yr old with diverticulitis      Plan:    I had an extensive discussion with the patient describing the disease process of diverticulitis, causes and potential effect.  We discussed that although this bout was a complicated flair the CT scan showed almost complete resolution 4 weeks out. I reviewed the CT scan and the images with him showing that his left colon had multiple diverticula.  Discussed avoiding flair foods such as nuts, seeds and popcorn for at least 2 months.  I explained that even with this flair if we were able in increase the fiber in his diet  and get normal soft BM regularly that it would help with the diverticulitis. I would not recommend elective colon resection without at least one more bout of diverticulitis or other symptoms, such as stricture or obstruction.  I also stressed the importance of drinking a large amount of water at least 8-12 glasses a day for good colon health.  We had an extensive discussion of diet modifications and how to increase fiber by foods as well as decrease soda/tea in diet and increase water intake.  Will send patient for another colonoscopy as well, had one 5 yrs prior but I would like another within 6 months after this bout of diverticulitis.  Will see him back in clinic after his colonoscopy to discuss any results and to see if the diet modifications are helping.   I spent at least 35 minutes with the patient discussing disease process and treatment options.

## 2015-12-31 ENCOUNTER — Telehealth: Payer: Self-pay | Admitting: Gastroenterology

## 2015-12-31 NOTE — Telephone Encounter (Signed)
Returned phone call to patient at this time. He states that he has increased physical activity and has not been drinking enough water, so he feels that is where his increased constipation has come from. He had severe abdominal pain over the weekend with fever and chills, then started having diarrhea and does not feel that he has any stool left in his body at this time. BM's have become normal, pain in abdomen has resolved completely, denies nausea and vomiting. Did not take temp during this period of time as he does not have a thermometer. Explained to patient that it sounds like a GI virus that he has had and since his abdominal pain and all other symptoms have resolved, we can play it by ear with antibiotics. He likes the idea of this but he was encouraged to call back if abdominal pain begins at all. Verbalizes understanding of conversation.

## 2015-12-31 NOTE — Telephone Encounter (Signed)
Patient has called and is having abdominal pain since 12/29/15. Patient is a previous patient of Lorenza BurtonKandice Jones, with a hx of diverticulitis. Please call patient to discuss relief of symptoms. [phone]

## 2016-03-04 ENCOUNTER — Other Ambulatory Visit: Payer: Self-pay

## 2016-03-06 ENCOUNTER — Telehealth: Payer: Self-pay | Admitting: General Surgery

## 2016-03-06 ENCOUNTER — Encounter: Payer: Self-pay | Admitting: General Surgery

## 2016-03-06 ENCOUNTER — Other Ambulatory Visit: Payer: Self-pay

## 2016-03-06 ENCOUNTER — Ambulatory Visit (INDEPENDENT_AMBULATORY_CARE_PROVIDER_SITE_OTHER): Payer: BLUE CROSS/BLUE SHIELD | Admitting: General Surgery

## 2016-03-06 VITALS — BP 155/83 | HR 95 | Temp 98.3°F | Ht 71.0 in | Wt 250.0 lb

## 2016-03-06 DIAGNOSIS — L723 Sebaceous cyst: Secondary | ICD-10-CM | POA: Diagnosis not present

## 2016-03-06 DIAGNOSIS — F17200 Nicotine dependence, unspecified, uncomplicated: Secondary | ICD-10-CM

## 2016-03-06 DIAGNOSIS — L732 Hidradenitis suppurativa: Secondary | ICD-10-CM

## 2016-03-06 DIAGNOSIS — K631 Perforation of intestine (nontraumatic): Secondary | ICD-10-CM

## 2016-03-06 MED ORDER — CLINDAMYCIN PHOSPHATE 1 % EX SOLN: Freq: Two times a day (BID) | CUTANEOUS | Status: DC

## 2016-03-06 NOTE — Progress Notes (Signed)
Outpatient Surgical Follow Up  03/06/2016  Tanner CatchingJoshua Wells Tanner Wells is an 40 y.o. male.   Chief Complaint  Patient presents with  . Other    Cyst on left axilla    HPI: 40 year old male returns to surgery clinic for evaluation of a cyst under his left axilla and his right groin. He is well-known to the surgery service secondary to a history of hidradenitis and diverticulitis with perforation that was treated with antibiotics last fall. He was lost to follow-up after his last visit and did not have his postop colonoscopy. Patient states that over the last 2 weeks he has had a cyst under his left axilla, that was similar to a cyst that had to be "opened" at [facility] many years ago. He has severe anxiety related to this and states that he would like to have it removed with him under anesthesia. He does not think it has been infected but it is getting larger. He also states he has had an area pop up on his right groin that is similar to previous hidradenitis he's had before. He's been putting hydrogen peroxide on this but nothing else. He states he thinks it has gotten smaller but also has anxiety about this and wants "Something Done". Patient denies any fevers, chills, nausea, vomiting, diarrhea, constipation, chest pain, shortness of breath. Patient states her some urgency to his treatment as he has a planned church trip to [place] next month.  Past Medical History  Diagnosis Date  . Diverticulitis   . Hypertension   . Anxiety   . GERD (gastroesophageal reflux disease)   . Hydradenitis     Past Surgical History  Procedure Laterality Date  . Colonoscopy      Family History  Problem Relation Age of Onset  . Hypertension Father   . Lung cancer Maternal Uncle   . Lung cancer Maternal Aunt   . Diabetes Mother     Social History:  reports that he has been smoking Cigarettes.  He has been smoking about 1.00 pack per day. He uses smokeless tobacco. He reports that he drinks about 3.6 oz of  alcohol per week. He reports that he uses illicit drugs (Marijuana).  Allergies:  Allergies  Allergen Reactions  . Bee Venom Swelling    Medications reviewed.    ROS A multipoint review of systems was completed. All pertinent positives and negatives are documented within the history of present illness and remainder are negative.   BP 155/83 mmHg  Pulse 95  Temp(Src) 98.3 F (36.8 C) (Oral)  Ht 5\' 11"  (1.803 m)  Wt 113.399 kg (250 lb)  BMI 34.88 kg/m2  Physical Exam Gen.: No acute distress Neck: Supple and nontender Lymph nodes: No evidence of cervical, axillary, clavicular lymphadenopathy Chest: Clear to auscultation Heart: Regular rate and rhythm Abdomen: Soft, nontender, nondistended Rectal: No evidence of recurrent rectal hidradenitis GU: Normal-appearing male genitalia Skin: 1.5 x 1 cm palpable, mobile cyst in the left axilla consistent with sebaceous cyst. 1 cm x 0.8 cm discolored raised area to the right groin consistent with hidradenitis. No spreading erythema or purulent drainage expressible. No other obvious skin lesions discovered. Extremities: Clubbing of the fingernails noted but moves all extremities well    No results found for this or any previous visit (from the past 48 hour(s)). No results found.  Assessment/Plan:  1. Hidradenitis 40 year old male with a known history of hidradenitis returns with an additional flare his right groin. He appears to have very little insight as disease  process. Discussed that first line treatment is topical clindamycin. Prescription for this was provided today. Extensive counseling had to be performed in order to convince him that this was directed to do and not to rush her mom immediately to the operating room. He will start treatment today in follow-up in clinic next week for a wound check to determine whether or not there is response to the topical clindamycin. Thus the signs and symptoms of worsening infection and to  return to clinic medially should they occur.  2. Sebaceous cyst Patient has a symptomatic, growing sebaceous cyst in his left axilla. Due to his anxiety he desires to have anesthesia on board to have it removed. The procedure of the local excision was discussed in detail to include the risks, benefits, alternatives. We will schedule him for removal of this left axilla sebaceous cyst on June 30. Also discussed with him that should his hidradenitis failed to have any response to the clindamycin that it could be possibly removed at the same setting. However, discussed that groin incisions are notoriously difficult to heal and could potentially jeopardize his planned trips to [place].  3. 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 He has not had a colonoscopy or To his scheduled follow-up. Discussed the importance of getting a colonoscopy since the perforation. He voiced understanding. We will plan to have this performed when he returns from his trip next month.  4. Tobacco use disorder Patient reports increased tobacco use secondary to stress. He understands of the increased tobacco use is actually lead to the worsening of his hidradenitis and sebaceous cyst. He is going to attempt to cut back to quit because he knows that his arm in his health.     Tanner Frameharles Hien Cunliffe, MD FACS General Surgeon  03/06/2016,10:06 AM

## 2016-03-06 NOTE — Patient Instructions (Addendum)
Let us give it a try by applying Clindamycin cream on the groin area for a week.  If it does go away, you are able to give us a call to cancel your appointment.  Please take Vitamin E to help you.

## 2016-03-06 NOTE — Telephone Encounter (Signed)
Pt advised of pre op date/time and sx date. Sx: 03/21/16 with Dr Ambrose MantleWoodham--left axillary cyst excision with possible right groin excision. Pre op: 03/13/16 between 9-1:00pm--Phone.   Patient made aware to call [phone], between 1-3:00pm the day before surgery, to find out what time to arrive.

## 2016-03-13 ENCOUNTER — Encounter: Payer: Self-pay | Admitting: *Deleted

## 2016-03-13 ENCOUNTER — Other Ambulatory Visit: Payer: 59

## 2016-03-13 NOTE — Patient Instructions (Addendum)
  Your procedure is scheduled on: 03-21-16 Report to Same Day Surgery 2nd floor medical mall To find out your arrival time please call [phone] between 1PM - 3PM on 03-20-16  Remember: Instructions that are not followed completely may result in serious medical risk, up to and including death, or upon the discretion of your surgeon and anesthesiologist your surgery may need to be rescheduled.    _x___ 1. Do not eat food or drink liquids after midnight. No gum chewing or hard candies.     __x__ 2. No Alcohol for 24 hours before or after surgery.   __x__3. No Smoking for 24 prior to surgery.   ____  4. Bring all medications with you on the day of surgery if instructed.    __x__ 5. Notify your doctor if there is any change in your medical condition     (cold, fever, infections).     Do not wear jewelry, make-up, hairpins, clips or nail polish.  Do not wear lotions, powders, or perfumes. You may wear deodorant.  Do not shave 48 hours prior to surgery. Men may shave face and neck.  Do not bring valuables to the hospital.    [facility] is not responsible for any belongings or valuables.               Contacts, dentures or bridgework may not be worn into surgery.  Leave your suitcase in the car. After surgery it may be brought to your room.  For patients admitted to the hospital, discharge time is determined by your treatment team.   Patients discharged the day of surgery will not be allowed to drive home.    Please read over the following fact sheets that you were given:   [facility] Preparing for Surgery and or MRSA Information   _x___ Take these medicines the morning of surgery with A SIP OF WATER:    1. DEXILANT  2. TAKE AN EXTRA DEXILANT ON Thursday NIGHT BEFORE BED  3. MAY TAKE KLONOPIN IF NEEDED DAY OF SURGERY  4.  5.  6.  ____ Fleet Enema (as directed)   ____ Use CHG Soap or sage wipes as directed on instruction sheet   ____ Use inhalers on the day of surgery and  bring to hospital day of surgery  ____ Stop metformin 2 days prior to surgery    ____ Take 1/2 of usual insulin dose the night before surgery and none on the morning of surgery.   ____ Stop aspirin or coumadin, or plavix  _x__ Stop Anti-inflammatories such as Advil, Aleve, Ibuprofen, Motrin, Naproxen,          Naprosyn, Goodies powders or aspirin products. Ok to take Tylenol.   ____ Stop supplements until after surgery.    ____ Bring C-Pap to the hospital.

## 2016-03-14 ENCOUNTER — Ambulatory Visit: Payer: Self-pay | Admitting: Surgery

## 2016-03-14 ENCOUNTER — Ambulatory Visit (INDEPENDENT_AMBULATORY_CARE_PROVIDER_SITE_OTHER): Payer: BLUE CROSS/BLUE SHIELD | Admitting: Surgery

## 2016-03-14 ENCOUNTER — Encounter: Payer: Self-pay | Admitting: Surgery

## 2016-03-14 VITALS — BP 145/88 | HR 76 | Temp 97.8°F | Ht 71.0 in | Wt 250.0 lb

## 2016-03-14 DIAGNOSIS — L720 Epidermal cyst: Secondary | ICD-10-CM | POA: Diagnosis not present

## 2016-03-14 NOTE — Progress Notes (Signed)
Outpatient Surgical Follow Up  03/14/2016  Tanner GongJoshua T Wells is an 40 y.o. male.   Chief Complaint  Patient presents with  . Follow-up    Sebaceous Cyst- Left Axilla    HPI: f/u for infected EIC, one on the left axilla and the other one on the Right groin. Improving, groin lesion almost gone. He still experiences some intermittent moderate pain on left axilla, no fevers, chills   Past Medical History  Diagnosis Date  . Diverticulitis   . Anxiety   . GERD (gastroesophageal reflux disease)   . Hydradenitis   . Hypertension     pt denies this during phone interview on 03-13-16  . Perforated bowel (HCC)     resolved on its own-no surgery    Past Surgical History  Procedure Laterality Date  . Colonoscopy      Family History  Problem Relation Age of Onset  . Hypertension Father   . Lung cancer Maternal Uncle   . Lung cancer Maternal Aunt   . Diabetes Mother     Social History:  reports that he has been smoking Cigarettes.  He has a 20 pack-year smoking history. He uses smokeless tobacco. He reports that he drinks about 3.6 oz of alcohol per week. He reports that he uses illicit drugs (Marijuana).  Allergies:  Allergies  Allergen Reactions  . Bee Venom Swelling  . Latex Rash    Medications reviewed.  ROS Negative   BP 145/88 mmHg  Pulse 76  Temp(Src) 97.8 F (36.6 C) (Oral)  Ht 5\' 11"  (1.803 m)  Wt 113.399 kg (250 lb)  BMI 34.88 kg/m2  Physical Exam NAD obese, non  Toxic Ext: warm and well perfused Skin: left 2 cm EIC mobile, tender on left axilla, no cellulitis or signs of infection   No results found for this or any previous visit (from the past 48 hour(s)). No results found.  Assessment/Plan:  1. EIC (epidermal inclusion cyst) To be schedule for excision by Dr. Tonita CongWoodham in the OR ( due to anxiety) No need for immediate interventions at this time     Sterling Bigiego Najir Roop, MD [facility] General Surgeon  03/14/2016,10:44 AM

## 2016-03-14 NOTE — Patient Instructions (Signed)
Your arm looks great today. We will continue with your surgery date of 03/21/16 with [facility].

## 2016-03-21 ENCOUNTER — Ambulatory Visit: Payer: BLUE CROSS/BLUE SHIELD | Admitting: Anesthesiology

## 2016-03-21 ENCOUNTER — Encounter: Payer: Self-pay | Admitting: *Deleted

## 2016-03-21 ENCOUNTER — Encounter
Admission: RE | Disposition: A | Discharge status: Home / Self Care | Payer: Self-pay | Source: Ambulatory Visit | Attending: General Surgery

## 2016-03-21 ENCOUNTER — Ambulatory Visit
Admission: RE | Admit: 2016-03-21 | Discharge: 2016-03-21 | Disposition: A | Discharge status: Home / Self Care | Payer: BLUE CROSS/BLUE SHIELD | Source: Ambulatory Visit | Attending: General Surgery | Admitting: General Surgery

## 2016-03-21 DIAGNOSIS — K219 Gastro-esophageal reflux disease without esophagitis: Secondary | ICD-10-CM | POA: Insufficient documentation

## 2016-03-21 DIAGNOSIS — R9431 Abnormal electrocardiogram [ECG] [EKG]: Secondary | ICD-10-CM

## 2016-03-21 DIAGNOSIS — I10 Essential (primary) hypertension: Secondary | ICD-10-CM | POA: Insufficient documentation

## 2016-03-21 DIAGNOSIS — F17210 Nicotine dependence, cigarettes, uncomplicated: Secondary | ICD-10-CM | POA: Insufficient documentation

## 2016-03-21 DIAGNOSIS — L720 Epidermal cyst: Secondary | ICD-10-CM | POA: Diagnosis not present

## 2016-03-21 DIAGNOSIS — F419 Anxiety disorder, unspecified: Secondary | ICD-10-CM | POA: Insufficient documentation

## 2016-03-21 DIAGNOSIS — Z8719 Personal history of other diseases of the digestive system: Secondary | ICD-10-CM | POA: Insufficient documentation

## 2016-03-21 HISTORY — PX: HYDRADENITIS EXCISION: SHX5243

## 2016-03-21 HISTORY — DX: Perforation of intestine (nontraumatic): K63.1

## 2016-03-21 LAB — URINE DRUG SCREEN, QUALITATIVE (ARMC ONLY)
Amphetamines, Ur Screen: NOT DETECTED
BARBITURATES, UR SCREEN: NOT DETECTED
Benzodiazepine, Ur Scrn: NOT DETECTED
CANNABINOID 50 NG, UR ~~LOC~~: POSITIVE — AB
COCAINE METABOLITE, UR ~~LOC~~: NOT DETECTED
MDMA (Ecstasy)Ur Screen: NOT DETECTED
Methadone Scn, Ur: NOT DETECTED
Opiate, Ur Screen: NOT DETECTED
PHENCYCLIDINE (PCP) UR S: NOT DETECTED
TRICYCLIC, UR SCREEN: NOT DETECTED

## 2016-03-21 SURGERY — EXCISION, HIDRADENITIS, AXILLA
Anesthesia: General | Laterality: Left | Wound class: Clean

## 2016-03-21 MED ORDER — MIDAZOLAM HCL 2 MG/2ML IJ SOLN
INTRAMUSCULAR | Status: AC
  Administered 2016-03-21: 2 mg via INTRAVENOUS
  Filled 2016-03-21: qty 2

## 2016-03-21 MED ORDER — FENTANYL CITRATE (PF) 100 MCG/2ML IJ SOLN
INTRAMUSCULAR | Status: DC | PRN
  Administered 2016-03-21 (×2): 50 ug via INTRAVENOUS

## 2016-03-21 MED ORDER — BACITRACIN ZINC 500 UNIT/GM EX OINT
TOPICAL_OINTMENT | CUTANEOUS | Status: AC
  Filled 2016-03-21: qty 28.35

## 2016-03-21 MED ORDER — ONDANSETRON HCL 4 MG/2ML IJ SOLN
4.0000 mg | Freq: Once | INTRAMUSCULAR | Status: AC | PRN
  Administered 2016-03-21: 4 mg via INTRAVENOUS

## 2016-03-21 MED ORDER — ACETAMINOPHEN-CODEINE #3 300-30 MG PO TABS: 1.0000 | ORAL_TABLET | ORAL | Status: DC | PRN

## 2016-03-21 MED ORDER — CEFAZOLIN SODIUM-DEXTROSE 2-4 GM/100ML-% IV SOLN
INTRAVENOUS | Status: AC
  Administered 2016-03-21: 2 g via INTRAVENOUS
  Filled 2016-03-21: qty 100

## 2016-03-21 MED ORDER — BUPIVACAINE HCL (PF) 0.5 % IJ SOLN
INTRAMUSCULAR | Status: DC | PRN
Start: 2016-03-21 — End: 2016-03-21
  Administered 2016-03-21: 5 mL

## 2016-03-21 MED ORDER — MIDAZOLAM HCL 2 MG/2ML IJ SOLN
INTRAMUSCULAR | Status: DC | PRN
  Administered 2016-03-21: 2 mg via INTRAVENOUS

## 2016-03-21 MED ORDER — ONDANSETRON HCL 4 MG/2ML IJ SOLN
INTRAMUSCULAR | Status: AC
  Administered 2016-03-21: 4 mg via INTRAVENOUS
  Filled 2016-03-21: qty 2

## 2016-03-21 MED ORDER — LIDOCAINE HCL (PF) 1 % IJ SOLN
INTRAMUSCULAR | Status: AC
  Filled 2016-03-21: qty 30

## 2016-03-21 MED ORDER — CHLORHEXIDINE GLUCONATE 4 % EX LIQD: 60.0000 mL | Freq: Once | CUTANEOUS | Status: DC

## 2016-03-21 MED ORDER — DEXAMETHASONE SODIUM PHOSPHATE 10 MG/ML IJ SOLN
INTRAMUSCULAR | Status: DC | PRN
  Administered 2016-03-21: 5 mg via INTRAVENOUS

## 2016-03-21 MED ORDER — FENTANYL CITRATE (PF) 100 MCG/2ML IJ SOLN
25.0000 ug | INTRAMUSCULAR | Status: DC | PRN
  Administered 2016-03-21 (×4): 25 ug via INTRAVENOUS

## 2016-03-21 MED ORDER — BUPIVACAINE HCL (PF) 0.5 % IJ SOLN
INTRAMUSCULAR | Status: AC
  Filled 2016-03-21: qty 30

## 2016-03-21 MED ORDER — GLYCOPYRROLATE 0.2 MG/ML IJ SOLN
INTRAMUSCULAR | Status: DC | PRN
  Administered 2016-03-21: 0.2 mg via INTRAVENOUS

## 2016-03-21 MED ORDER — LACTATED RINGERS IV SOLN
INTRAVENOUS | Status: DC
  Administered 2016-03-21: 10:00:00 via INTRAVENOUS

## 2016-03-21 MED ORDER — CEFAZOLIN SODIUM-DEXTROSE 2-4 GM/100ML-% IV SOLN
2.0000 g | INTRAVENOUS | Status: AC
  Administered 2016-03-21: 2 g via INTRAVENOUS

## 2016-03-21 MED ORDER — ONDANSETRON HCL 4 MG/2ML IJ SOLN
INTRAMUSCULAR | Status: DC | PRN
  Administered 2016-03-21: 4 mg via INTRAVENOUS

## 2016-03-21 MED ORDER — PROPOFOL 10 MG/ML IV BOLUS
INTRAVENOUS | Status: DC | PRN
  Administered 2016-03-21: 230 mg via INTRAVENOUS

## 2016-03-21 MED ORDER — LIDOCAINE HCL (CARDIAC) 20 MG/ML IV SOLN
INTRAVENOUS | Status: DC | PRN
  Administered 2016-03-21: 60 mg via INTRAVENOUS

## 2016-03-21 MED ORDER — LIDOCAINE HCL (PF) 1 % IJ SOLN
INTRAMUSCULAR | Status: DC | PRN
  Administered 2016-03-21: 5 mL

## 2016-03-21 MED ORDER — MIDAZOLAM HCL 2 MG/2ML IJ SOLN
2.0000 mg | Freq: Once | INTRAMUSCULAR | Status: AC
  Administered 2016-03-21: 2 mg via INTRAVENOUS

## 2016-03-21 MED ORDER — FENTANYL CITRATE (PF) 100 MCG/2ML IJ SOLN
INTRAMUSCULAR | Status: AC
  Administered 2016-03-21: 25 ug via INTRAVENOUS
  Filled 2016-03-21: qty 2

## 2016-03-21 SURGICAL SUPPLY — 21 items
BLADE SURG 15 STRL LF DISP TIS (BLADE) ×1 IMPLANT
BLADE SURG 15 STRL SS (BLADE) ×2
CANISTER SUCT 1200ML W/VALVE (MISCELLANEOUS) ×3 IMPLANT
CHLORAPREP W/TINT 26ML (MISCELLANEOUS) ×3 IMPLANT
DRAPE LAPAROTOMY 100X77 ABD (DRAPES) ×3 IMPLANT
ELECT CAUTERY BLADE 6.4 (BLADE) ×3 IMPLANT
ELECT REM PT RETURN 9FT ADLT (ELECTROSURGICAL) ×3
ELECTRODE REM PT RTRN 9FT ADLT (ELECTROSURGICAL) ×1 IMPLANT
GAUZE SPONGE 4X4 12PLY STRL (GAUZE/BANDAGES/DRESSINGS) ×3 IMPLANT
GLOVE BIO SURGEON STRL SZ7.5 (GLOVE) ×3 IMPLANT
GLOVE INDICATOR 8.0 STRL GRN (GLOVE) ×3 IMPLANT
GOWN STRL REUS W/ TWL LRG LVL3 (GOWN DISPOSABLE) ×2 IMPLANT
GOWN STRL REUS W/TWL LRG LVL3 (GOWN DISPOSABLE) ×4
KIT RM TURNOVER STRD PROC AR (KITS) ×3 IMPLANT
LABEL OR SOLS (LABEL) ×3 IMPLANT
NEEDLE HYPO 25X1 1.5 SAFETY (NEEDLE) ×3 IMPLANT
NS IRRIG 500ML POUR BTL (IV SOLUTION) ×3 IMPLANT
PACK BASIN MINOR ARMC (MISCELLANEOUS) ×3 IMPLANT
SUT ETHILON 2 0 FS 18 (SUTURE) ×9 IMPLANT
SYR BULB EAR ULCER 3OZ GRN STR (SYRINGE) ×3 IMPLANT
SYRINGE 10CC LL (SYRINGE) ×3 IMPLANT

## 2016-03-21 NOTE — Anesthesia Procedure Notes (Signed)
Procedure Name: LMA Insertion Date/Time: 03/21/2016 11:04 AM Performed by: Irving BurtonBACHICH, Jaquez Farrington Pre-anesthesia Checklist: Patient identified, Emergency Drugs available, Suction available and Patient being monitored Patient Re-evaluated:Patient Re-evaluated prior to inductionOxygen Delivery Method: Circle system utilized Preoxygenation: Pre-oxygenation with 100% oxygen Intubation Type: IV induction Ventilation: Mask ventilation without difficulty LMA: LMA inserted LMA Size: 4.5 Number of attempts: 1 Placement Confirmation: positive ETCO2 and breath sounds checked- equal and bilateral Tube secured with: Tape Dental Injury: Teeth and Oropharynx as per pre-operative assessment

## 2016-03-21 NOTE — Interval H&P Note (Signed)
History and Physical Interval Note:  03/21/2016 9:57 AM  Tanner Wells  has presented today for surgery, with the diagnosis of HIDRADENITIS  The various methods of treatment have been discussed with the patient and family. After consideration of risks, benefits and other options for treatment, the patient has consented to  Procedure(s): LEFT AXILLARY CYST EXCISION, POSSIBLE RIGHT GROIN EXCISION  (Left) as a surgical intervention .  The patient's history has been reviewed, patient examined, no change in status, stable for surgery.  I have reviewed the patient's chart and labs.  Questions were answered to the patient's satisfaction.     Tanner Frameharles Elijah Wells

## 2016-03-21 NOTE — Brief Op Note (Signed)
03/21/2016  11:25 AM  PATIENT:  Tanner GongJoshua T Wells  40 y.o. male  PRE-OPERATIVE DIAGNOSIS:  HIDRADENITIS  POST-OPERATIVE DIAGNOSIS:  HIDRADENITIS  PROCEDURE:  Procedure(s): LEFT AXILLARY CYST EXCISION, POSSIBLE RIGHT GROIN EXCISION  (Left)  SURGEON:  Surgeon(s) and Role:    * Ricarda Frameharles Gurdeep Keesey, MD - Primary  PHYSICIAN ASSISTANT:   ASSISTANTS: none   ANESTHESIA:   general  EBL:     BLOOD ADMINISTERED:none  DRAINS: none   LOCAL MEDICATIONS USED:  MARCAINE   , XYLOCAINE  and Amount: 10 ml  SPECIMEN:  Source of Specimen:  left axillary cyst  DISPOSITION OF SPECIMEN:  PATHOLOGY  COUNTS:  YES  TOURNIQUET:  * No tourniquets in log *  DICTATION: .Dragon Dictation  PLAN OF CARE: Discharge to home after PACU  PATIENT DISPOSITION:  PACU - hemodynamically stable.   Delay start of Pharmacological VTE agent (>24hrs) due to surgical blood loss or risk of bleeding: not applicable

## 2016-03-21 NOTE — Anesthesia Postprocedure Evaluation (Signed)
Anesthesia Post Note  Patient: Tanner GongJoshua T Wells  Procedure(s) Performed: Procedure(s) (LRB): LEFT AXILLARY CYST EXCISION, POSSIBLE RIGHT GROIN EXCISION  (Left)  Patient location during evaluation: PACU Anesthesia Type: General Level of consciousness: awake Pain management: pain level controlled Vital Signs Assessment: post-procedure vital signs reviewed and stable Respiratory status: spontaneous breathing Cardiovascular status: stable Anesthetic complications: no    Last Vitals:  Filed Vitals:   03/21/16 1222 03/21/16 1231  BP: 119/78   Pulse: 48 45  Temp: 36.2 C   Resp: 10 10    Last Pain:  Filed Vitals:   03/21/16 1232  PainSc: 2                  VAN STAVEREN,Akiel Fennell

## 2016-03-21 NOTE — Discharge Instructions (Signed)
Excision of Skin Lesions, Care After Refer to this sheet in the next few weeks. These instructions provide you with information about caring for yourself after your procedure. Your health care provider may also give you more specific instructions. Your treatment has been planned according to current medical practices, but problems sometimes occur. Call your health care provider if you have any problems or questions after your procedure. WHAT TO EXPECT AFTER THE PROCEDURE After your procedure, it is common to have pain or discomfort at the excision site. HOME CARE INSTRUCTIONS  Take over-the-counter and prescription medicines only as told by your health care provider.  Follow instructions from your health care provider about:  How to take care of your excision site. You should keep the site clean, dry, and protected for at least 48 hours.  When and how you should change your bandage (dressing).  When you should remove your dressing.  Removing whatever was used to close your excision site.  Check the excision area every day for signs of infection. Watch for:  Redness, swelling, or pain.  Fluid, blood, or pus.  For bleeding, apply gentle but firm pressure to the area using a folded towel for 20 minutes.  Avoid high-impact exercise and activities until the stitches (sutures) are removed or the area heals.  Follow instructions from your health care provider about how to minimize scarring. Avoid sun exposure until the area has healed. Scarring should lessen over time.  Keep all follow-up visits as told by your health care provider. This is important. SEEK MEDICAL CARE IF:  You have a fever.  You have redness, swelling, or pain at the excision site.  You have fluid, blood, or pus coming from the excision site.  You have ongoing bleeding at the excision site.  You have pain that does not improve in 2-3 days after your procedure.  You notice skin irregularities or changes in  sensation.   This information is not intended to replace advice given to you by your health care provider. Make sure you discuss any questions you have with your health care provider.   Document Released: 01/23/2015 Document Reviewed: 01/23/2015 Elsevier Interactive Patient Education 2016 [employer]  AMBULATORY SURGERY  DISCHARGE INSTRUCTIONS  1) The drugs that you were given will stay in your system until tomorrow so for the next 24 hours you should not:  A) Drive an automobile B) Make any legal decisions C) Drink any alcoholic beverage  2) You may resume regular meals tomorrow.  Today it is better to start with liquids and gradually work up to solid foods.  You may eat anything you prefer, but it is better to start with liquids, then soup and crackers, and gradually work up to solid foods.  3) Please notify your doctor immediately if you have any unusual bleeding, trouble breathing, redness and pain at the surgery site, drainage, fever, or pain not relieved by medication.  4) Additional Instructions: TAKE A STOOL SOFTENER TWICE A DAY WHILE TAKING NARCOTIC PAIN MEDICINE TO PREVENT CONSTIPATION  Please contact your physician with any problems or Same Day Surgery at [phone], Monday through Friday 6 am to 4 pm, or [place] at [facility] Main number at [phone].

## 2016-03-21 NOTE — Op Note (Signed)
   Pre-operative Diagnosis: Left axillary skin cyst  Post-operative Diagnosis: Same  Surgeon: Ricarda Frameharles June Vacha   Assistants: None  Anesthesia: General LMA anesthesia  ASA Class: 2  Surgeon: Ricarda Frameharles Lynzee Lindquist, MD FACS  Anesthesia: Gen. with endotracheal tube  Assistant:None  Procedure Details  The patient was seen again in the Holding Room. The benefits, complications, treatment options, and expected outcomes were discussed with the patient. The risks of bleeding, infection, recurrence of symptoms, failure to resolve symptoms,  any of which could require further surgery were reviewed with the patient.   The patient was taken to Operating Room, identified as Cristina GongJoshua T [place] and the procedure verified.  A Time Out was held and the above information confirmed.  Prior to the induction of general anesthesia, antibiotic prophylaxis was administered. VTE prophylaxis was in place. General endotracheal anesthesia was then administered and tolerated well. After the induction, the left axilla was prepped with Chloraprep and draped in the sterile fashion. The patient was positioned in the supine position.  The palpable cyst was localized in all directions with a 50-50 mixture of 1% lidocaine and 0.5% Marcaine plain. An elliptical incision was made around this using a 15 blade scalpel and using Bovie electrocautery it was excised excised in toto. Meticulous hemostasis was insured. Entire area was re-localized the aforementioned local anesthetic. The skin was reapproximated with interrupted 2-0 nylon suture. A dressing of bacitracin and sterile gauze was placed over this. The patient tolerated procedure well. He was awoken from 100 mask anesthesia and transferred to the PACU in good condition. There were no immediate, complications and all counts are correct at the end of the procedure.   Findings: Left axillary cyst   Estimated Blood Loss: 2 mL's         Drains: None         Specimens: Left  axillary cyst          Complications: None                  Condition: Good   Ricarda Frameharles Kacyn Souder, MD, FACS

## 2016-03-21 NOTE — Anesthesia Preprocedure Evaluation (Signed)
Anesthesia Evaluation  Patient identified by MRN, date of birth, ID band Patient awake    Reviewed: Allergy & Precautions, NPO status , Patient's Chart, lab work & pertinent test results  History of Anesthesia Complications Negative for: history of anesthetic complications  Airway Mallampati: II       Dental  (+) Teeth Intact   Pulmonary Current Smoker,     + decreased breath sounds      Cardiovascular Exercise Tolerance: Good hypertension,  Rhythm:Regular     Neuro/Psych Anxiety    GI/Hepatic Neg liver ROS, GERD  Medicated,  Endo/Other  negative endocrine ROS  Renal/GU negative Renal ROS     Musculoskeletal negative musculoskeletal ROS (+)   Abdominal (+) + obese,   Peds negative pediatric ROS (+)  Hematology negative hematology ROS (+)   Anesthesia Other Findings   Reproductive/Obstetrics                             Anesthesia Physical Anesthesia Plan  ASA: III  Anesthesia Plan: General   Post-op Pain Management:    Induction: Intravenous  Airway Management Planned: LMA  Additional Equipment:   Intra-op Plan:   Post-operative Plan: Extubation in OR  Informed Consent: I have reviewed the patients History and Physical, chart, labs and discussed the procedure including the risks, benefits and alternatives for the proposed anesthesia with the patient or authorized representative who has indicated his/her understanding and acceptance.     Plan Discussed with:   Anesthesia Plan Comments:         Anesthesia Quick Evaluation

## 2016-03-21 NOTE — H&P (View-Only) (Signed)
Outpatient Surgical Follow Up  03/06/2016  Tanner Wells is an 40 y.o. male.   Chief Complaint  Patient presents with  . Other    Cyst on left axilla    HPI: 40-year-old male returns to surgery clinic for evaluation of a cyst under his left axilla and his right groin. He is well-known to the surgery service secondary to a history of hidradenitis and diverticulitis with perforation that was treated with antibiotics last fall. He was lost to follow-up after his last visit and did not have his postop colonoscopy. Patient states that over the last 2 weeks he has had a cyst under his left axilla, that was similar to a cyst that had to be "opened" at [facility] many years ago. He has severe anxiety related to this and states that he would like to have it removed with him under anesthesia. He does not think it has been infected but it is getting larger. He also states he has had an area pop up on his right groin that is similar to previous hidradenitis he's had before. He's been putting hydrogen peroxide on this but nothing else. He states he thinks it has gotten smaller but also has anxiety about this and wants "Something Done". Patient denies any fevers, chills, nausea, vomiting, diarrhea, constipation, chest pain, shortness of breath. Patient states her some urgency to his treatment as he has a planned church trip to [place] next month.  Past Medical History  Diagnosis Date  . Diverticulitis   . Hypertension   . Anxiety   . GERD (gastroesophageal reflux disease)   . Hydradenitis     Past Surgical History  Procedure Laterality Date  . Colonoscopy      Family History  Problem Relation Age of Onset  . Hypertension Father   . Lung cancer Maternal Uncle   . Lung cancer Maternal Aunt   . Diabetes Mother     Social History:  reports that he has been smoking Cigarettes.  He has been smoking about 1.00 pack per day. He uses smokeless tobacco. He reports that he drinks about 3.6 oz of  alcohol per week. He reports that he uses illicit drugs (Marijuana).  Allergies:  Allergies  Allergen Reactions  . Bee Venom Swelling    Medications reviewed.    ROS A multipoint review of systems was completed. All pertinent positives and negatives are documented within the history of present illness and remainder are negative.   BP 155/83 mmHg  Pulse 95  Temp(Src) 98.3 F (36.8 C) (Oral)  Ht 5' 11" (1.803 m)  Wt 113.399 kg (250 lb)  BMI 34.88 kg/m2  Physical Exam Gen.: No acute distress Neck: Supple and nontender Lymph nodes: No evidence of cervical, axillary, clavicular lymphadenopathy Chest: Clear to auscultation Heart: Regular rate and rhythm Abdomen: Soft, nontender, nondistended Rectal: No evidence of recurrent rectal hidradenitis GU: Normal-appearing male genitalia Skin: 1.5 x 1 cm palpable, mobile cyst in the left axilla consistent with sebaceous cyst. 1 cm x 0.8 cm discolored raised area to the right groin consistent with hidradenitis. No spreading erythema or purulent drainage expressible. No other obvious skin lesions discovered. Extremities: Clubbing of the fingernails noted but moves all extremities well    No results found for this or any previous visit (from the past 48 hour(s)). No results found.  Assessment/Plan:  1. Hidradenitis 40-year-old male with a known history of hidradenitis returns with an additional flare his right groin. He appears to have very little insight as disease   process. Discussed that first line treatment is topical clindamycin. Prescription for this was provided today. Extensive counseling had to be performed in order to convince him that this was directed to do and not to rush her mom immediately to the operating room. He will start treatment today in follow-up in clinic next week for a wound check to determine whether or not there is response to the topical clindamycin. Thus the signs and symptoms of worsening infection and to  return to clinic medially should they occur.  2. Sebaceous cyst Patient has a symptomatic, growing sebaceous cyst in his left axilla. Due to his anxiety he desires to have anesthesia on board to have it removed. The procedure of the local excision was discussed in detail to include the risks, benefits, alternatives. We will schedule him for removal of this left axilla sebaceous cyst on June 30. Also discussed with him that should his hidradenitis failed to have any response to the clindamycin that it could be possibly removed at the same setting. However, discussed that groin incisions are notoriously difficult to heal and could potentially jeopardize his planned trips to [place].  3. 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 He has not had a colonoscopy or To his scheduled follow-up. Discussed the importance of getting a colonoscopy since the perforation. He voiced understanding. We will plan to have this performed when he returns from his trip next month.  4. Tobacco use disorder Patient reports increased tobacco use secondary to stress. He understands of the increased tobacco use is actually lead to the worsening of his hidradenitis and sebaceous cyst. He is going to attempt to cut back to quit because he knows that his arm in his health.     Ricarda Frameharles Divine Imber, MD FACS General Surgeon  03/06/2016,10:06 AM

## 2016-03-21 NOTE — Transfer of Care (Signed)
Immediate Anesthesia Transfer of Care Note  Patient: Tanner GongJoshua T Wells  Procedure(s) Performed: Procedure(s): LEFT AXILLARY CYST EXCISION, POSSIBLE RIGHT GROIN EXCISION  (Left)  Patient Location: PACU  Anesthesia Type:General  Level of Consciousness: sedated  Airway & Oxygen Therapy: Patient Spontanous Breathing and Patient connected to face mask oxygen  Post-op Assessment: Report given to RN and Post -op Vital signs reviewed and stable  Post vital signs: Reviewed and stable  Last Vitals:  Filed Vitals:   03/21/16 1130 03/21/16 1137  BP:  138/85  Pulse:  73  Temp: 35.9 C 35.9 C  Resp:  18    Complications: No apparent anesthesia complications

## 2016-03-26 LAB — SURGICAL PATHOLOGY

## 2016-04-02 ENCOUNTER — Encounter: Payer: Self-pay | Admitting: Surgery

## 2016-04-02 ENCOUNTER — Ambulatory Visit (INDEPENDENT_AMBULATORY_CARE_PROVIDER_SITE_OTHER): Payer: BLUE CROSS/BLUE SHIELD | Admitting: Surgery

## 2016-04-02 ENCOUNTER — Encounter: Payer: BLUE CROSS/BLUE SHIELD | Admitting: Surgery

## 2016-04-02 VITALS — BP 141/91 | HR 65 | Temp 97.5°F | Wt 250.0 lb

## 2016-04-02 DIAGNOSIS — L723 Sebaceous cyst: Secondary | ICD-10-CM

## 2016-04-02 NOTE — Progress Notes (Signed)
40 yr old male s/p excision of left axillary epidermal inclusion cyst. Patient has been doing well since is still some itching and pulling in the area but thinks this is from stitches. Patient states that he has been using some deodorant. Patient states that his been no redness or drainage from the area. Patient does state that occasionally he'll get these spells where he gets short of breath feels anxiety and feels heart palpitations. The patient does have a history of anxiety and he has not seen his PCP for this in a while.  Filed Vitals:   04/02/16 0938  BP: 141/91  Pulse: 65  Temp: 97.5 F (36.4 C)   PE:  Gen: NAD Left axilla: incision site healing well, stitches removed, no erythema or drainage   A/P:  Patient is healing well after left axillary EIC mobile. I did discuss the pathology with him. I also injected the patient that he needs a follow-up with his PCP for his panic attacks sometime within the week. Patient to call us with any questions or concerns

## 2016-04-02 NOTE — Patient Instructions (Signed)
Please give us a call if you have any questions or concerns. 

## 2016-04-04 DIAGNOSIS — L720 Epidermal cyst: Secondary | ICD-10-CM | POA: Insufficient documentation

## 2016-04-07 ENCOUNTER — Encounter: Payer: BLUE CROSS/BLUE SHIELD | Admitting: General Surgery

## 2016-11-11 ENCOUNTER — Emergency Department: Payer: BLUE CROSS/BLUE SHIELD

## 2016-11-11 ENCOUNTER — Emergency Department
Admission: EM | Admit: 2016-11-11 | Discharge: 2016-11-11 | Disposition: A | Discharge status: Home / Self Care | Payer: BLUE CROSS/BLUE SHIELD | Attending: Emergency Medicine | Admitting: Emergency Medicine

## 2016-11-11 ENCOUNTER — Telehealth: Payer: Self-pay

## 2016-11-11 ENCOUNTER — Encounter: Payer: Self-pay | Admitting: *Deleted

## 2016-11-11 DIAGNOSIS — I10 Essential (primary) hypertension: Secondary | ICD-10-CM | POA: Diagnosis not present

## 2016-11-11 DIAGNOSIS — K5792 Diverticulitis of intestine, part unspecified, without perforation or abscess without bleeding: Secondary | ICD-10-CM | POA: Diagnosis not present

## 2016-11-11 DIAGNOSIS — R1030 Lower abdominal pain, unspecified: Secondary | ICD-10-CM | POA: Diagnosis present

## 2016-11-11 DIAGNOSIS — F17210 Nicotine dependence, cigarettes, uncomplicated: Secondary | ICD-10-CM | POA: Insufficient documentation

## 2016-11-11 LAB — COMPREHENSIVE METABOLIC PANEL
ALBUMIN: 4.2 g/dL (ref 3.5–5.0)
ALT: 30 U/L (ref 17–63)
ANION GAP: 8 (ref 5–15)
AST: 21 U/L (ref 15–41)
Alkaline Phosphatase: 74 U/L (ref 38–126)
BUN: 9 mg/dL (ref 6–20)
CHLORIDE: 106 mmol/L (ref 101–111)
CO2: 22 mmol/L (ref 22–32)
Calcium: 8.9 mg/dL (ref 8.9–10.3)
Creatinine, Ser: 0.74 mg/dL (ref 0.61–1.24)
GFR calc Af Amer: >60 mL/min (ref >60)
GFR calc non Af Amer: >60 mL/min (ref >60)
GLUCOSE: 86 mg/dL (ref 65–99)
POTASSIUM: 3.8 mmol/L (ref 3.5–5.1)
SODIUM: 136 mmol/L (ref 135–145)
Total Bilirubin: 1.3 mg/dL — ABNORMAL HIGH (ref 0.3–1.2)
Total Protein: 7.4 g/dL (ref 6.5–8.1)

## 2016-11-11 LAB — CBC
HEMATOCRIT: 50.4 % (ref 40.0–52.0)
HEMOGLOBIN: 17.5 g/dL (ref 13.0–18.0)
MCH: 32.3 pg (ref 26.0–34.0)
MCHC: 34.7 g/dL (ref 32.0–36.0)
MCV: 93.1 fL (ref 80.0–100.0)
Platelets: 228 10*3/uL (ref 150–440)
RBC: 5.41 MIL/uL (ref 4.40–5.90)
RDW: 13.7 % (ref 11.5–14.5)
WBC: 10.2 10*3/uL (ref 3.8–10.6)

## 2016-11-11 LAB — URINALYSIS, COMPLETE (UACMP) WITH MICROSCOPIC
BACTERIA UA: NONE SEEN
BILIRUBIN URINE: NEGATIVE
Glucose, UA: NEGATIVE mg/dL
HGB URINE DIPSTICK: NEGATIVE
Ketones, ur: NEGATIVE mg/dL
LEUKOCYTES UA: NEGATIVE
NITRITE: NEGATIVE
PROTEIN: NEGATIVE mg/dL
SPECIFIC GRAVITY, URINE: 1.012 (ref 1.005–1.030)
SQUAMOUS EPITHELIAL / LPF: NONE SEEN
pH: 5 (ref 5.0–8.0)

## 2016-11-11 LAB — LIPASE, BLOOD: LIPASE: 38 U/L (ref 11–51)

## 2016-11-11 MED ORDER — SODIUM CHLORIDE 0.9 % IV BOLUS (SEPSIS)
1000.0000 mL | Freq: Once | INTRAVENOUS | Status: AC
  Administered 2016-11-11: 1000 mL via INTRAVENOUS

## 2016-11-11 MED ORDER — MORPHINE SULFATE (PF) 4 MG/ML IV SOLN
4.0000 mg | Freq: Once | INTRAVENOUS | Status: AC
  Administered 2016-11-11: 4 mg via INTRAVENOUS
  Filled 2016-11-11: qty 1

## 2016-11-11 MED ORDER — IOPAMIDOL (ISOVUE-300) INJECTION 61%
30.0000 mL | Freq: Once | INTRAVENOUS | Status: DC | PRN
  Filled 2016-11-11: qty 30

## 2016-11-11 MED ORDER — IOPAMIDOL (ISOVUE-300) INJECTION 61%
100.0000 mL | Freq: Once | INTRAVENOUS | Status: AC | PRN
  Administered 2016-11-11: 100 mL via INTRAVENOUS
  Filled 2016-11-11: qty 100

## 2016-11-11 MED ORDER — CIPROFLOXACIN HCL 500 MG PO TABS
500.0000 mg | ORAL_TABLET | Freq: Once | ORAL | Status: AC
  Administered 2016-11-11: 500 mg via ORAL
  Filled 2016-11-11: qty 1

## 2016-11-11 MED ORDER — METRONIDAZOLE 500 MG PO TABS: 500.0000 mg | ORAL_TABLET | Freq: Three times a day (TID) | ORAL | 0 refills | Status: AC

## 2016-11-11 MED ORDER — HYDROCODONE-ACETAMINOPHEN 5-325 MG PO TABS
1.0000 | ORAL_TABLET | Freq: Once | ORAL | Status: AC
  Administered 2016-11-11: 1 via ORAL
  Filled 2016-11-11: qty 1

## 2016-11-11 MED ORDER — ONDANSETRON HCL 4 MG/2ML IJ SOLN
4.0000 mg | Freq: Once | INTRAMUSCULAR | Status: AC
  Administered 2016-11-11: 4 mg via INTRAVENOUS
  Filled 2016-11-11: qty 2

## 2016-11-11 MED ORDER — CIPROFLOXACIN HCL 500 MG PO TABS: 500.0000 mg | ORAL_TABLET | Freq: Two times a day (BID) | ORAL | 0 refills | Status: AC

## 2016-11-11 MED ORDER — METRONIDAZOLE 500 MG PO TABS
500.0000 mg | ORAL_TABLET | Freq: Once | ORAL | Status: AC
  Administered 2016-11-11: 500 mg via ORAL
  Filled 2016-11-11: qty 1

## 2016-11-11 MED ORDER — HYDROCODONE-ACETAMINOPHEN 5-325 MG PO TABS: 1.0000 | ORAL_TABLET | ORAL | 0 refills | Status: DC | PRN

## 2016-11-11 NOTE — Telephone Encounter (Signed)
Severe Abdominal Pain x 2 days- 7/10 pain. Positive for Nausea, Denies vomiting, Positive for Fever/Chills- Has not taken temp., Hasn't taken anything for pain, Regular Bowel Movements. Took Linzess to get bowels to move and was successful but didn't relieve pain. Pain is occurring from umbilicus to BLQ and having severe rectal pains.  Spoke with Dr. Excell Seltzerooper in regards to this at this time. Patient is to go to the Emergency Department immediately. This was explained to patient.

## 2016-11-11 NOTE — ED Provider Notes (Signed)
[facility] Emergency Department Provider Note  Time seen: 5:29 PM  I have reviewed the triage vital signs and the nursing notes.   HISTORY  Chief Complaint Abdominal Pain    HPI Tanner Wells is a 41 y.o. male with a past medical history of diverticulitis with prior perforation, gastric reflux, hypertension, presents to the emergency department lower abdominal pain. According to the patient for the past several days he has been experiencing lower abdominal discomfort. He thought he could be constipated so he took a laxative yesterday with good bowel movement but no relief of the discomfort. States nausea but denies any vomiting. Denies fever, dysuria, black or bloody stool. Describes the pain as moderate dull aching pain in the lower abdomen especially left lower quadrant.  Past Medical History:  Diagnosis Date  . Anxiety   . Diverticulitis   . GERD (gastroesophageal reflux disease)   . Hydradenitis   . Hypertension    pt denies this during phone interview on 03-13-16  . Perforated bowel (HCC)    resolved on its own-no surgery    Patient Active Problem List   Diagnosis Date Noted  . Epidermal inclusion cyst   . Tobacco use disorder 03/06/2016  . Acute diverticulitis 05/26/2015  . Intestinal perforation (HCC) 05/26/2015  . Benign essential HTN 05/26/2015  . Anxiety 05/26/2015  . Dehydration 05/26/2015  . GERD (gastroesophageal reflux disease) 05/26/2015  . Sepsis (HCC) 05/26/2015  . Chewing tobacco use 06/07/2014  . GERD 05/01/2009  . OTHER SYMPTOMS INVOLVING DIGESTIVE SYSTEM OTHER 05/01/2009  . ABDOMINAL PAIN -GENERALIZED 05/01/2009    Past Surgical History:  Procedure Laterality Date  . COLONOSCOPY    . HYDRADENITIS EXCISION Left 03/21/2016   Procedure: LEFT AXILLARY CYST EXCISION, POSSIBLE RIGHT GROIN EXCISION ;  Surgeon: Ricarda Frame, MD;  Location: [facility] ORS;  Service: General;  Laterality: Left;    Prior to Admission medications    Medication Sig Start Date End Date Taking? Authorizing Provider  acetaminophen-codeine (TYLENOL #3) 300-30 MG tablet Take 1-2 tablets by mouth every 4 (four) hours as needed for moderate pain. 03/21/16   Ricarda Frame, MD  AMITIZA 24 MCG capsule Take 24 mcg by mouth as needed.  03/01/15   Historical Provider, MD  clindamycin (CLEOCIN-T) 1 % external solution Apply topically 2 (two) times daily. 03/06/16   Ricarda Frame, MD  clonazePAM (KLONOPIN) 0.5 MG tablet TAKE ONE TABLET BY MOUTH TWICE A DAY AS NEEDED FOR ANXIETY 12/11/15   Historical Provider, MD  DEXILANT 60 MG capsule Take 60 mg by mouth as needed.  05/08/15   Historical Provider, MD    Allergies  Allergen Reactions  . Bee Venom Swelling  . Latex Rash    Family History  Problem Relation Age of Onset  . Hypertension Father   . Lung cancer Maternal Uncle   . Lung cancer Maternal Aunt   . Diabetes Mother     Social History Social History  Substance Use Topics  . Smoking status: Current Every Day Smoker    Packs/day: 1.00    Years: 20.00    Types: Cigarettes  . Smokeless tobacco: Current User  . Alcohol use 3.6 oz/week    6 Shots of liquor per week     Comment: occ    Review of Systems Constitutional: Negative for fever Cardiovascular: Negative for chest pain. Respiratory: Negative for shortness of breath. Gastrointestinal: Lower abdominal pain. Genitourinary: Negative for dysuria. Neurological: Negative for headache 10-point ROS otherwise negative.  ____________________________________________  PHYSICAL EXAM:  VITAL SIGNS: ED Triage Vitals  Enc Vitals Group     BP 11/11/16 1447 (!) 148/97     Pulse Rate 11/11/16 1447 83     Resp 11/11/16 1447 16     Temp 11/11/16 1447 98.1 F (36.7 C)     Temp Source 11/11/16 1447 Oral     SpO2 11/11/16 1447 97 %     Weight 11/11/16 1445 250 lb (113.4 kg)     Height 11/11/16 1445 5\' 11"  (1.803 m)     Head Circumference --      Peak Flow --      Pain Score 11/11/16  1445 7     Pain Loc --      Pain Edu? --      Excl. in GC? --     Constitutional: Alert and oriented. Well appearing and in no distress. Eyes: Normal exam ENT   Head: Normocephalic and atraumatic.   Mouth/Throat: Mucous membranes are moist. Cardiovascular: Normal rate, regular rhythm. No murmur Respiratory: Normal respiratory effort without tachypnea nor retractions. Breath sounds are clear  Gastrointestinal: Soft, moderate lower abdominal tenderness palpation, no rebound or guarding. No distention. Musculoskeletal: Nontender with normal range of motion in all extremities. Neurologic:  Normal speech and language. No gross focal neurologic deficits  Skin:  Skin is warm, dry and intact.  Psychiatric: Mood and affect are normal.   ____________________________________________   RADIOLOGY  IMPRESSION: Acute diverticulitis at the mid sigmoid colon, with focal soft tissue inflammation, wall thickening and inflamed diverticula. Trace free fluid seen. No evidence of perforation or abscess formation at this time.  ____________________________________________   INITIAL IMPRESSION / ASSESSMENT AND PLAN / ED COURSE  Pertinent labs & imaging results that were available during my care of the patient were reviewed by me and considered in my medical decision making (see chart for details).  The patient presents the emergency department left lower caution and lower abdominal discomfort for the past several days. Describes as moderate dull aching pain. Positive for nausea. We will treat pain and nausea, IV hydrate. Patient's labs are largely within normal limits however given the patient's moderate tenderness to palpation with prior bowel perforation we'll obtain a CT of the abdomen/pelvis to further evaluate. Patient agreeable to plan.  CT scan consistent with acute diverticulitis without perforation or abscess. We will place on ciprofloxacin, Flagyl, Norco have the patient follow-up  with a primary care doctor in 7 days for recheck since reevaluation. I discussed return precautions for acute worsening of pain and fever.  ____________________________________________   FINAL CLINICAL IMPRESSION(S) / ED DIAGNOSES  Lower abdominal pain Acute diverticulitis   Minna AntisKevin Dagoberto Nealy, MD 11/11/16 [phone]

## 2016-11-11 NOTE — ED Triage Notes (Signed)
Pt to ED reporting abd burning and pressure beginning 2 weeks with increase in pain yesterday. Pt reports having normal BM and urination. Pt reports having had a perforated bowel in September and denies that this feels the same as that but reports entire abd is in pain at this time. Pt reports cold sweats but denies having taken temperature. Pt reports nausea be ginning last night but denies vomiting.

## 2017-06-25 ENCOUNTER — Emergency Department: Payer: BLUE CROSS/BLUE SHIELD

## 2017-06-25 ENCOUNTER — Encounter: Payer: Self-pay | Admitting: Emergency Medicine

## 2017-06-25 ENCOUNTER — Emergency Department
Admission: EM | Admit: 2017-06-25 | Discharge: 2017-06-25 | Disposition: A | Discharge status: Home / Self Care | Payer: BLUE CROSS/BLUE SHIELD | Attending: Emergency Medicine | Admitting: Emergency Medicine

## 2017-06-25 DIAGNOSIS — Y990 Civilian activity done for income or pay: Secondary | ICD-10-CM | POA: Insufficient documentation

## 2017-06-25 DIAGNOSIS — I10 Essential (primary) hypertension: Secondary | ICD-10-CM | POA: Insufficient documentation

## 2017-06-25 DIAGNOSIS — Y9389 Activity, other specified: Secondary | ICD-10-CM | POA: Diagnosis not present

## 2017-06-25 DIAGNOSIS — Z23 Encounter for immunization: Secondary | ICD-10-CM | POA: Diagnosis not present

## 2017-06-25 DIAGNOSIS — S6991XA Unspecified injury of right wrist, hand and finger(s), initial encounter: Secondary | ICD-10-CM | POA: Insufficient documentation

## 2017-06-25 DIAGNOSIS — Y929 Unspecified place or not applicable: Secondary | ICD-10-CM | POA: Diagnosis not present

## 2017-06-25 DIAGNOSIS — W294XXA Contact with nail gun, initial encounter: Secondary | ICD-10-CM | POA: Diagnosis not present

## 2017-06-25 DIAGNOSIS — F17210 Nicotine dependence, cigarettes, uncomplicated: Secondary | ICD-10-CM | POA: Diagnosis not present

## 2017-06-25 DIAGNOSIS — F419 Anxiety disorder, unspecified: Secondary | ICD-10-CM | POA: Insufficient documentation

## 2017-06-25 MED ORDER — HYDROCODONE-ACETAMINOPHEN 5-325 MG PO TABS: 1.0000 | ORAL_TABLET | ORAL | 0 refills | Status: DC | PRN

## 2017-06-25 MED ORDER — CEPHALEXIN 500 MG PO CAPS: 500.0000 mg | ORAL_CAPSULE | Freq: Four times a day (QID) | ORAL | 0 refills | Status: AC

## 2017-06-25 MED ORDER — TETANUS-DIPHTH-ACELL PERTUSSIS 5-2.5-18.5 LF-MCG/0.5 IM SUSP
0.5000 mL | Freq: Once | INTRAMUSCULAR | Status: AC
  Administered 2017-06-25: 0.5 mL via INTRAMUSCULAR
  Filled 2017-06-25: qty 0.5

## 2017-06-25 NOTE — ED Notes (Signed)
Cleaned right hand with sterile water and surgical scrub.

## 2017-06-25 NOTE — ED Notes (Signed)
Pt was at work and was working with a nail gun. The nail ricochet off some metal and onto his right wrist. Puncture wound seen. Bleeding is under control.

## 2017-06-25 NOTE — ED Provider Notes (Signed)
[facility] Emergency Department Provider Note  ____________________________________________  Time seen: Approximately 1:40 PM  I have reviewed the triage vital signs and the nursing notes.   HISTORY  Chief Complaint Wrist Pain    HPI Tanner Wells is a 41 y.o. male that presents to the emergency department for evaluation of right wrist injury. Patient was at work using a nail gun when the nail went through the top of his right wrist. He has full strength in his hand. He is not having any difficulty moving hand or wrist. He pulled the nail out with a coworker using pliers.He does not recall last tetanus shot. No additional injuries.  Numbness, tingling.   Past Medical History:  Diagnosis Date  . Anxiety   . Diverticulitis   . GERD (gastroesophageal reflux disease)   . Hydradenitis   . Hypertension    pt denies this during phone interview on 03-13-16  . Perforated bowel (HCC)    resolved on its own-no surgery    Patient Active Problem List   Diagnosis Date Noted  . Epidermal inclusion cyst   . Tobacco use disorder 03/06/2016  . Acute diverticulitis 05/26/2015  . Intestinal perforation (HCC) 05/26/2015  . Benign essential HTN 05/26/2015  . Anxiety 05/26/2015  . Dehydration 05/26/2015  . GERD (gastroesophageal reflux disease) 05/26/2015  . Sepsis (HCC) 05/26/2015  . Chewing tobacco use 06/07/2014  . GERD 05/01/2009  . OTHER SYMPTOMS INVOLVING DIGESTIVE SYSTEM OTHER 05/01/2009  . ABDOMINAL PAIN -GENERALIZED 05/01/2009    Past Surgical History:  Procedure Laterality Date  . COLONOSCOPY    . HYDRADENITIS EXCISION Left 03/21/2016   Procedure: LEFT AXILLARY CYST EXCISION, POSSIBLE RIGHT GROIN EXCISION ;  Surgeon: Ricarda Frame, MD;  Location: [facility] ORS;  Service: General;  Laterality: Left;    Prior to Admission medications   Medication Sig Start Date End Date Taking? Authorizing Provider  acetaminophen-codeine (TYLENOL #3) 300-30 MG tablet  Take 1-2 tablets by mouth every 4 (four) hours as needed for moderate pain. 03/21/16   Ricarda Frame, MD  AMITIZA 24 MCG capsule Take 24 mcg by mouth as needed.  03/01/15   [provider]  cephALEXin (KEFLEX) 500 MG capsule Take 1 capsule (500 mg total) by mouth 4 (four) times daily. 06/25/17 07/05/17  Enid Derry, PA-C  clindamycin (CLEOCIN-T) 1 % external solution Apply topically 2 (two) times daily. 03/06/16   Ricarda Frame, MD  clonazePAM (KLONOPIN) 0.5 MG tablet TAKE ONE TABLET BY MOUTH TWICE A DAY AS NEEDED FOR ANXIETY 12/11/15   [provider]  DEXILANT 60 MG capsule Take 60 mg by mouth as needed.  05/08/15   [provider]  HYDROcodone-acetaminophen (NORCO) 5-325 MG tablet Take 1 tablet by mouth every 4 (four) hours as needed for moderate pain. 06/25/17   Enid Derry, PA-C    Allergies Bee venom and Latex  Family History  Problem Relation Age of Onset  . Hypertension Father   . Lung cancer Maternal Uncle   . Lung cancer Maternal Aunt   . Diabetes Mother     Social History Social History  Substance Use Topics  . Smoking status: Current Every Day Smoker    Packs/day: 1.00    Years: 20.00    Types: Cigarettes  . Smokeless tobacco: Current User  . Alcohol use 3.6 oz/week    6 Shots of liquor per week     Comment: occ     Review of Systems  Constitutional: No fever/chills Cardiovascular: No chest pain.  Respiratory: No SOB. Gastrointestinal: No abdominal pain.  No nausea, no vomiting.  Musculoskeletal: Positive for wrist pain. Skin: Negative for rash, ecchymosis.   ____________________________________________   PHYSICAL EXAM:  VITAL SIGNS: ED Triage Vitals [06/25/17 1226]  Enc Vitals Group     BP (!) 160/100     Pulse Rate 79     Resp 17     Temp 98.8 F (37.1 C)     Temp Source Oral     SpO2 97 %     Weight 250 lb (113.4 kg)     Height  (1.778 m)     Head Circumference      Peak Flow      Pain Score 7     Pain  Loc      Pain Edu?      Excl. in GC?      Constitutional: Alert and oriented. Well appearing and in no acute distress. Eyes: Conjunctivae are normal. PERRL. EOMI. Head: Atraumatic. ENT:      Ears:      Nose: No congestion/rhinnorhea.      Mouth/Throat: Mucous membranes are moist.  Neck: No stridor. Cardiovascular: Normal rate, regular rhythm.  Good peripheral circulation. Respiratory: Normal respiratory effort without tachypnea or retractions. Lungs CTAB. Good air entry to the bases with no decreased or absent breath sounds. Gastrointestinal: Bowel sounds 4 quadrants. Soft and nontender to palpation. No guarding or rigidity. No palpable masses. No distention. No  Musculoskeletal: Full range of motion to all extremities. No gross deformities appreciated. Strength 5 out of 5 in right hand and wrist. Neurologic:  Normal speech and language. No gross focal neurologic deficits are appreciated.  Skin:  Skin is warm, dry and intact. Two puncture wounds to the dorsal side of right wrist. No bleeding.  ____________________________________________   LABS (all labs ordered are listed, but only abnormal results are displayed)  Labs Reviewed - No data to display ____________________________________________  EKG   ____________________________________________  RADIOLOGY Lexine Baton, personally viewed and evaluated these images (plain radiographs) as part of my medical decision making, as well as reviewing the written report by the radiologist.  Dg Wrist Complete Right  Result Date: 06/25/2017 CLINICAL DATA:  Right wrist pain after nail gun injury. EXAM: RIGHT WRIST - COMPLETE 3+ VIEW COMPARISON:  None. FINDINGS: There is no evidence of fracture or dislocation. There is no evidence of arthropathy or other focal bone abnormality. Soft tissues are unremarkable. No radiopaque foreign body is noted. IMPRESSION: Normal right wrist. Electronically Signed   By: Lupita Raider, M.D.   On:  06/25/2017 13:11    ____________________________________________    PROCEDURES  Procedure(s) performed:    Procedures    Medications  Tdap (BOOSTRIX) injection 0.5 mL (0.5 mLs Intramuscular Given 06/25/17 1356)     ____________________________________________   INITIAL IMPRESSION / ASSESSMENT AND PLAN / ED COURSE  Pertinent labs & imaging results that were available during my care of the patient were reviewed by me and considered in my medical decision making (see chart for details).  Review of the [place] CSRS was performed in accordance of the [facility] prior to dispensing any controlled drugs.   Patient presented to emergency department for evaluation after injuring himself with a nail gun. Vital signs and exam are reassuring. No acute bony abnormalities or foreign bodies on x-ray. Area was cleaned with sterile water and surgical scrub. Tetanus shot was updated. Patient will be discharged home with prescriptions for Kelflex. Patient is to follow  up with PCP as directed. Patient is given ED precautions to return to the ED for any worsening or new symptoms.     ____________________________________________  FINAL CLINICAL IMPRESSION(S) / ED DIAGNOSES  Final diagnoses:  Accident caused by nail gun, initial encounter      NEW MEDICATIONS STARTED DURING THIS VISIT:  Discharge Medication List as of 06/25/2017  2:16 PM    START taking these medications   Details  cephALEXin (KEFLEX) 500 MG capsule Take 1 capsule (500 mg total) by mouth 4 (four) times daily., Starting Thu 06/25/2017, Until Sun 07/05/2017, Print            This chart was dictated using voice recognition software/Dragon. Despite best efforts to proofread, errors can occur which can change the meaning. Any change was purely unintentional.    Enid Derry, PA-C 06/26/17 1304    Governor Rooks, MD 06/26/17 [phone]

## 2017-06-25 NOTE — ED Triage Notes (Signed)
Patient presents to ED via POV from work. Does not wish to file WC at this time. Patient was using a nail gun when a nail went through his right posterior wrist. Patient self removed the nail. No bleeding noted at this time.

## 2018-03-06 IMAGING — CT CT ABD-PELV W/ CM
2 of 4 series · 16 of 46 positions shown, 18 images · IV contrast (APPLIED)
Comparison: CT of the abdomen and pelvis from 06/19/2015

CLINICAL DATA: Subacute onset of generalized abdominal burning and
pressure. Nausea. Initial encounter.

EXAM:
CT ABDOMEN AND PELVIS WITH CONTRAST
TECHNIQUE: Multidetector CT imaging of the abdomen and pelvis was performed
using the standard protocol following bolus administration of
intravenous contrast.
CONTRAST:  100mL MEEEWW-WZZ IOPAMIDOL (MEEEWW-WZZ) INJECTION 61%

[Series 2: routine abd/pel with · axial · 0.89mm/px · z∈[-549,-74]mm · 13 of 105 slices shown, 15 images]
[im 5/105  soft-tissue]
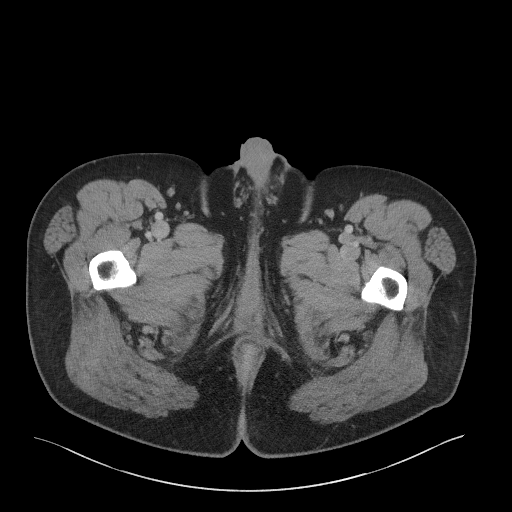
[im 5/105  bone]
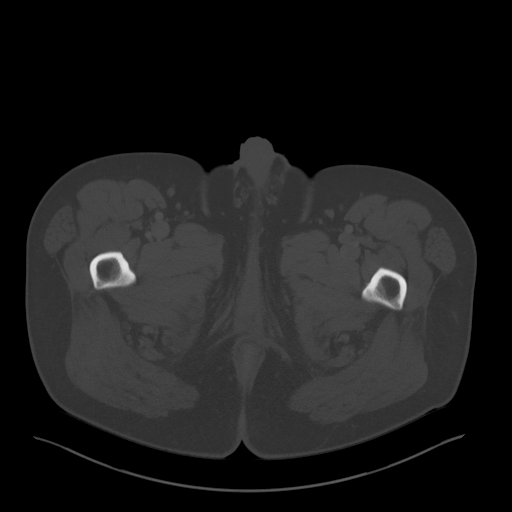
[im 14/105  soft-tissue]
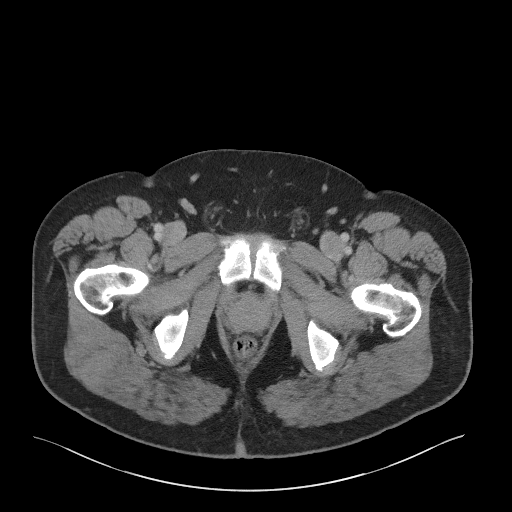
[im 23/105  soft-tissue]
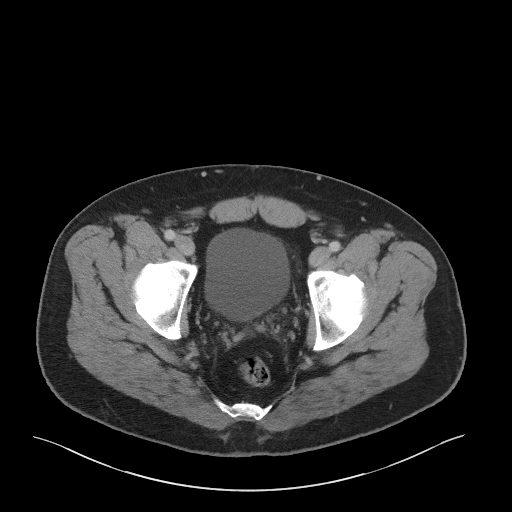
[im 28/105  soft-tissue]
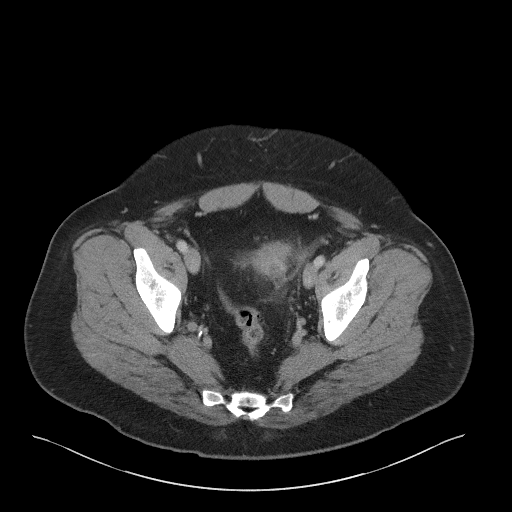
[im 37/105  soft-tissue]
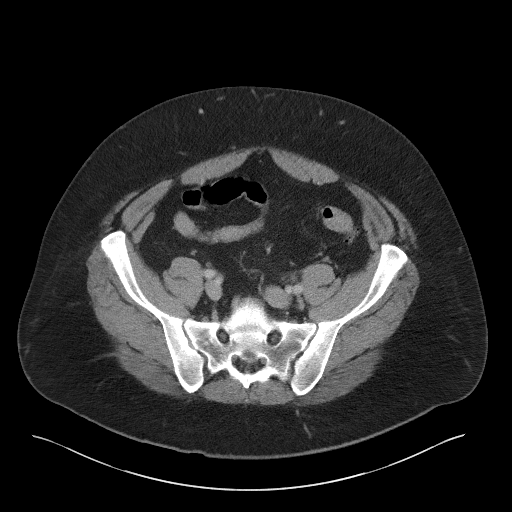
[im 46/105  soft-tissue]
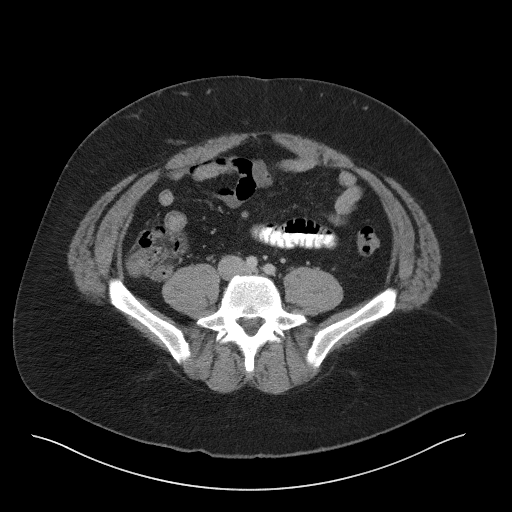
[im 55/105  soft-tissue]
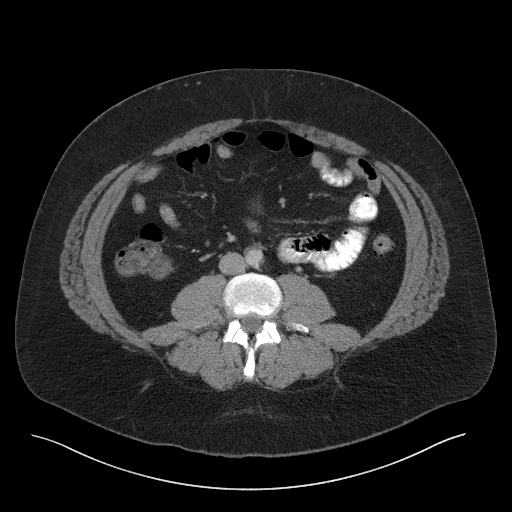
[im 59/105  soft-tissue]
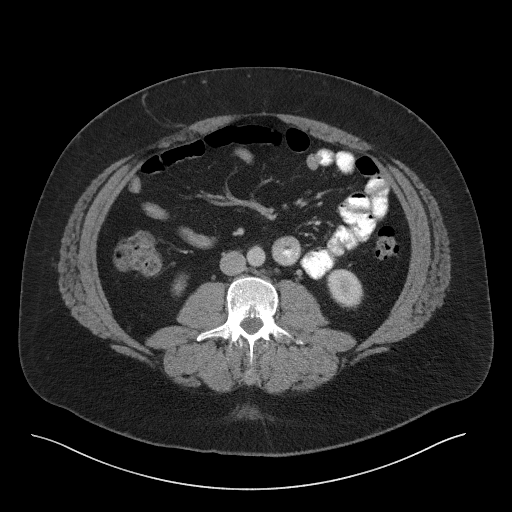
[im 68/105  soft-tissue]
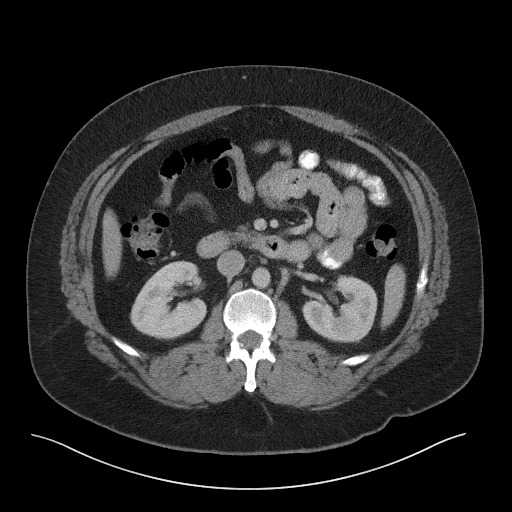
[im 68/105  bone]
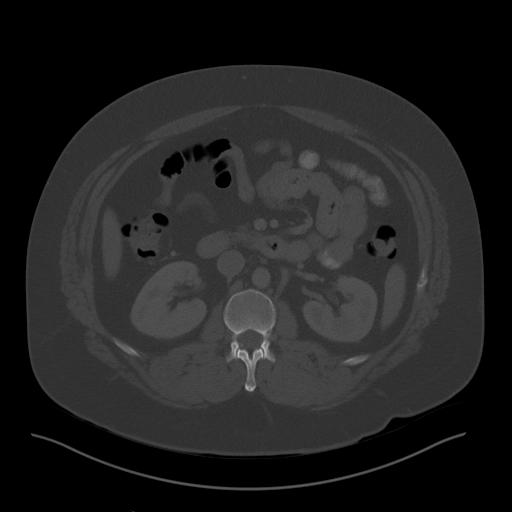
[im 77/105  soft-tissue]
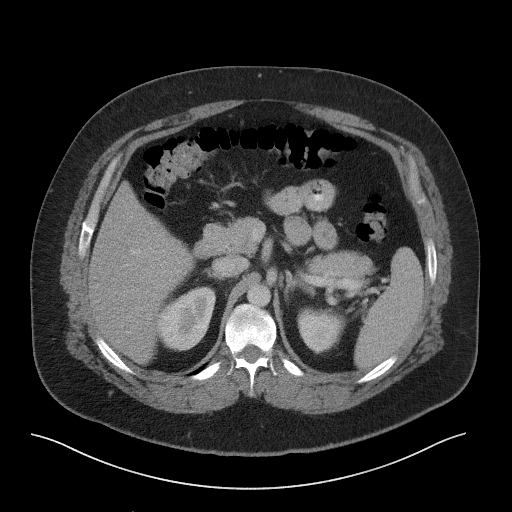
[im 82/105  soft-tissue]
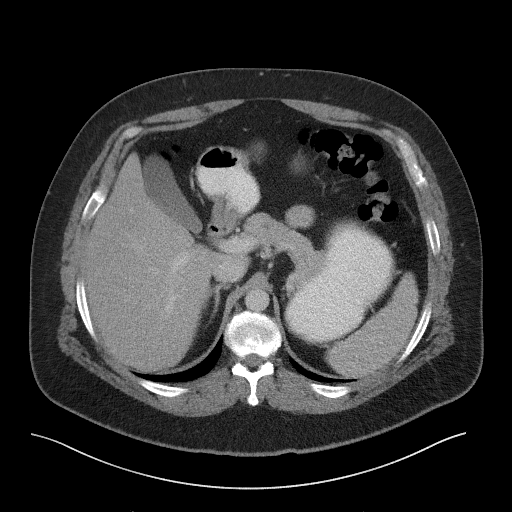
[im 91/105  soft-tissue]
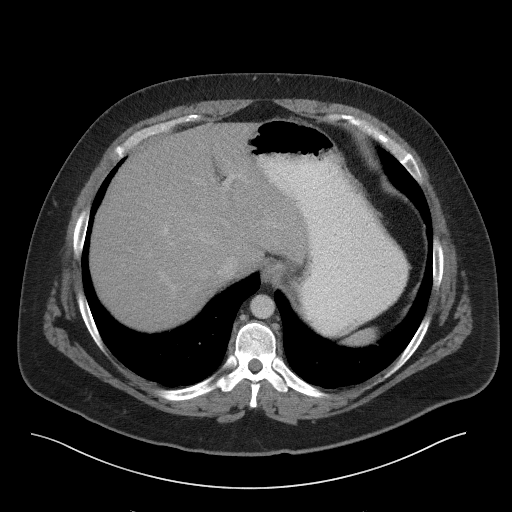
[im 100/105  soft-tissue]
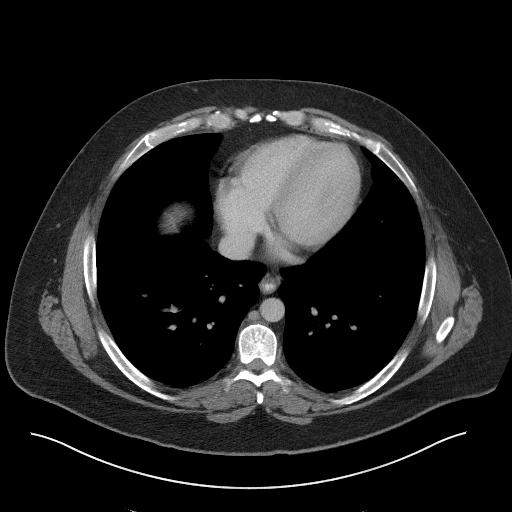

[Series 5: coronal st · coronal · 0.96mm/px · 3 of 114 slices shown]
[im 38/114  soft-tissue]
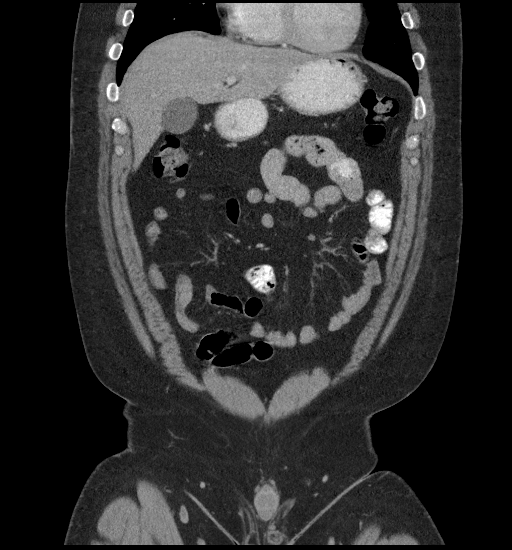
[im 51/114  soft-tissue]
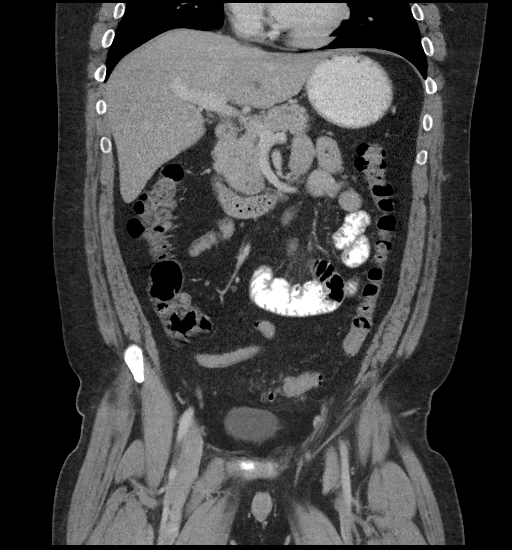
[im 63/114  soft-tissue]
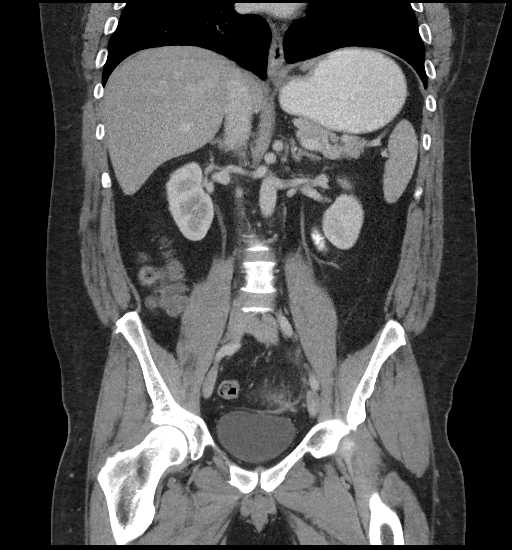

[16 of 46 positions shown; findings below may reference images not displayed]

FINDINGS: Lower chest: Minimal bibasilar atelectasis or scarring is noted. The
visualized portions of the mediastinum are unremarkable.

Hepatobiliary: The liver is unremarkable in appearance. The
gallbladder is unremarkable in appearance. The common bile duct
remains normal in caliber.

Pancreas: The pancreas is within normal limits.

Spleen: The spleen is unremarkable in appearance.

Adrenals/Urinary Tract: The adrenal glands are unremarkable in
appearance. The kidneys are within normal limits. There is no
evidence of hydronephrosis. No renal or ureteral stones are
identified. No perinephric stranding is seen.

Stomach/Bowel: The stomach is unremarkable in appearance. The small
bowel is within normal limits. The appendix is normal in caliber,
without evidence of appendicitis.

Focal soft tissue inflammation and wall thickening is noted at the
mid sigmoid colon, with inflamed diverticula and trace free fluid,
compatible with acute diverticulitis. There is no evidence of
perforation or abscess formation at this time.

Vascular/Lymphatic: The abdominal aorta is unremarkable in
appearance. The inferior vena cava is grossly unremarkable. No
retroperitoneal lymphadenopathy is seen. No pelvic sidewall
lymphadenopathy is identified.

Reproductive: The bladder is mildly distended and grossly
unremarkable. The prostate remains normal in size.

Other: No acute osseous abnormalities are identified. The visualized
musculature is unremarkable in appearance.

Musculoskeletal: No acute osseous abnormalities are identified. The
visualized musculature is unremarkable in appearance.
IMPRESSION: Acute diverticulitis at the mid sigmoid colon, with focal soft
tissue inflammation, wall thickening and inflamed diverticula. Trace
free fluid seen. No evidence of perforation or abscess formation at
this time.

## 2018-05-18 ENCOUNTER — Other Ambulatory Visit: Payer: Self-pay | Admitting: Family Medicine

## 2018-05-18 DIAGNOSIS — R9389 Abnormal findings on diagnostic imaging of other specified body structures: Secondary | ICD-10-CM

## 2018-05-19 ENCOUNTER — Ambulatory Visit
Admission: RE | Admit: 2018-05-19 | Discharge: 2018-05-19 | Disposition: A | Discharge status: Home / Self Care | Payer: BLUE CROSS/BLUE SHIELD | Source: Ambulatory Visit | Attending: Family Medicine | Admitting: Family Medicine

## 2018-05-19 DIAGNOSIS — R9389 Abnormal findings on diagnostic imaging of other specified body structures: Secondary | ICD-10-CM

## 2018-05-19 DIAGNOSIS — M899 Disorder of bone, unspecified: Secondary | ICD-10-CM | POA: Insufficient documentation

## 2018-05-19 DIAGNOSIS — R937 Abnormal findings on diagnostic imaging of other parts of musculoskeletal system: Secondary | ICD-10-CM | POA: Diagnosis present

## 2018-05-20 ENCOUNTER — Other Ambulatory Visit: Payer: Self-pay | Admitting: Family Medicine

## 2018-05-20 DIAGNOSIS — R9389 Abnormal findings on diagnostic imaging of other specified body structures: Secondary | ICD-10-CM

## 2018-06-03 ENCOUNTER — Ambulatory Visit: Payer: BLUE CROSS/BLUE SHIELD

## 2020-08-26 ENCOUNTER — Encounter: Payer: Self-pay | Admitting: Emergency Medicine

## 2020-08-26 ENCOUNTER — Other Ambulatory Visit: Payer: Self-pay

## 2020-08-26 ENCOUNTER — Emergency Department: Payer: BLUE CROSS/BLUE SHIELD

## 2020-08-26 ENCOUNTER — Emergency Department
Admission: EM | Admit: 2020-08-26 | Discharge: 2020-08-26 | Discharge status: Against Medical Advice | Payer: BLUE CROSS/BLUE SHIELD | Attending: Emergency Medicine | Admitting: Emergency Medicine

## 2020-08-26 DIAGNOSIS — R0981 Nasal congestion: Secondary | ICD-10-CM | POA: Diagnosis not present

## 2020-08-26 DIAGNOSIS — R059 Cough, unspecified: Secondary | ICD-10-CM | POA: Diagnosis present

## 2020-08-26 DIAGNOSIS — Z5321 Procedure and treatment not carried out due to patient leaving prior to being seen by health care provider: Secondary | ICD-10-CM | POA: Diagnosis not present

## 2020-08-26 LAB — CBC
HCT: 53 % — ABNORMAL HIGH (ref 39.0–52.0)
Hemoglobin: 18.2 g/dL — ABNORMAL HIGH (ref 13.0–17.0)
MCH: 33 pg (ref 26.0–34.0)
MCHC: 34.3 g/dL (ref 30.0–36.0)
MCV: 96 fL (ref 80.0–100.0)
Platelets: 156 10*3/uL (ref 150–400)
RBC: 5.52 MIL/uL (ref 4.22–5.81)
RDW: 14 % (ref 11.5–15.5)
WBC: 8.5 10*3/uL (ref 4.0–10.5)
nRBC: 0 % (ref 0.0–0.2)

## 2020-08-26 LAB — TROPONIN I (HIGH SENSITIVITY): Troponin I (High Sensitivity): 7 ng/L (ref <18)

## 2020-08-26 LAB — BASIC METABOLIC PANEL
Anion gap: 8 (ref 5–15)
BUN: 6 mg/dL (ref 6–20)
CO2: 26 mmol/L (ref 22–32)
Calcium: 8.8 mg/dL — ABNORMAL LOW (ref 8.9–10.3)
Chloride: 100 mmol/L (ref 98–111)
Creatinine, Ser: 0.77 mg/dL (ref 0.61–1.24)
GFR, Estimated: >60 mL/min (ref >60)
Glucose, Bld: 108 mg/dL — ABNORMAL HIGH (ref 70–99)
Potassium: 3.8 mmol/L (ref 3.5–5.1)
Sodium: 134 mmol/L — ABNORMAL LOW (ref 135–145)

## 2020-08-26 NOTE — ED Triage Notes (Signed)
Pt arrived via POV with reports of COVID sxs x 2 days, pt reports cough, headache and congestion. Pt reports he loss of taste and loss of smell.  Pt c/o cold chills as well.

## 2022-01-08 ENCOUNTER — Emergency Department: Payer: 59

## 2022-01-08 ENCOUNTER — Emergency Department
Admission: EM | Admit: 2022-01-08 | Discharge: 2022-01-08 | Disposition: A | Discharge status: Other Acute Inpatient Hospital | Payer: 59 | Attending: Emergency Medicine | Admitting: Emergency Medicine

## 2022-01-08 DIAGNOSIS — R7309 Other abnormal glucose: Secondary | ICD-10-CM | POA: Insufficient documentation

## 2022-01-08 DIAGNOSIS — S066XAA Traumatic subarachnoid hemorrhage with loss of consciousness status unknown, initial encounter: Secondary | ICD-10-CM | POA: Diagnosis not present

## 2022-01-08 DIAGNOSIS — I10 Essential (primary) hypertension: Secondary | ICD-10-CM | POA: Diagnosis not present

## 2022-01-08 DIAGNOSIS — X58XXXA Exposure to other specified factors, initial encounter: Secondary | ICD-10-CM | POA: Insufficient documentation

## 2022-01-08 DIAGNOSIS — S066X9A Traumatic subarachnoid hemorrhage with loss of consciousness of unspecified duration, initial encounter: Secondary | ICD-10-CM

## 2022-01-08 DIAGNOSIS — S0990XA Unspecified injury of head, initial encounter: Secondary | ICD-10-CM | POA: Diagnosis present

## 2022-01-08 LAB — CBC WITH DIFFERENTIAL/PLATELET
Abs Immature Granulocytes: 0.08 10*3/uL — ABNORMAL HIGH (ref 0.00–0.07)
Basophils Absolute: 0.1 10*3/uL (ref 0.0–0.1)
Basophils Relative: 1 %
Eosinophils Absolute: 0.2 10*3/uL (ref 0.0–0.5)
Eosinophils Relative: 1 %
HCT: 53.3 % — ABNORMAL HIGH (ref 39.0–52.0)
Hemoglobin: 17.6 g/dL — ABNORMAL HIGH (ref 13.0–17.0)
Immature Granulocytes: 1 %
Lymphocytes Relative: 20 %
Lymphs Abs: 2.6 10*3/uL (ref 0.7–4.0)
MCH: 31.8 pg (ref 26.0–34.0)
MCHC: 33 g/dL (ref 30.0–36.0)
MCV: 96.4 fL (ref 80.0–100.0)
Monocytes Absolute: 1 10*3/uL (ref 0.1–1.0)
Monocytes Relative: 8 %
Neutro Abs: 9.3 10*3/uL — ABNORMAL HIGH (ref 1.7–7.7)
Neutrophils Relative %: 69 %
Platelets: 261 10*3/uL (ref 150–400)
RBC: 5.53 MIL/uL (ref 4.22–5.81)
RDW: 13.2 % (ref 11.5–15.5)
WBC: 13.3 10*3/uL — ABNORMAL HIGH (ref 4.0–10.5)
nRBC: 0 % (ref 0.0–0.2)

## 2022-01-08 LAB — URINALYSIS, ROUTINE W REFLEX MICROSCOPIC
Bilirubin Urine: NEGATIVE
Glucose, UA: >=500 mg/dL — AB
Hgb urine dipstick: NEGATIVE
Ketones, ur: 20 mg/dL — AB
Leukocytes,Ua: NEGATIVE
Nitrite: NEGATIVE
Protein, ur: >=300 mg/dL — AB
Specific Gravity, Urine: 1.016 (ref 1.005–1.030)
pH: 5 (ref 5.0–8.0)

## 2022-01-08 LAB — COMPREHENSIVE METABOLIC PANEL
ALT: 60 U/L — ABNORMAL HIGH (ref 0–44)
AST: 55 U/L — ABNORMAL HIGH (ref 15–41)
Albumin: 4 g/dL (ref 3.5–5.0)
Alkaline Phosphatase: 68 U/L (ref 38–126)
Anion gap: 16 — ABNORMAL HIGH (ref 5–15)
BUN: 10 mg/dL (ref 6–20)
CO2: 18 mmol/L — ABNORMAL LOW (ref 22–32)
Calcium: 8.5 mg/dL — ABNORMAL LOW (ref 8.9–10.3)
Chloride: 104 mmol/L (ref 98–111)
Creatinine, Ser: 0.99 mg/dL (ref 0.61–1.24)
GFR, Estimated: >60 mL/min (ref >60)
Glucose, Bld: 240 mg/dL — ABNORMAL HIGH (ref 70–99)
Potassium: 3.4 mmol/L — ABNORMAL LOW (ref 3.5–5.1)
Sodium: 138 mmol/L (ref 135–145)
Total Bilirubin: 0.9 mg/dL (ref 0.3–1.2)
Total Protein: 7.3 g/dL (ref 6.5–8.1)

## 2022-01-08 LAB — URINE DRUG SCREEN, QUALITATIVE (ARMC ONLY)
Amphetamines, Ur Screen: NOT DETECTED
Barbiturates, Ur Screen: NOT DETECTED
Benzodiazepine, Ur Scrn: NOT DETECTED
Cannabinoid 50 Ng, Ur ~~LOC~~: POSITIVE — AB
Cocaine Metabolite,Ur ~~LOC~~: NOT DETECTED
MDMA (Ecstasy)Ur Screen: NOT DETECTED
Methadone Scn, Ur: NOT DETECTED
Opiate, Ur Screen: POSITIVE — AB
Phencyclidine (PCP) Ur S: NOT DETECTED
Tricyclic, Ur Screen: NOT DETECTED

## 2022-01-08 LAB — LIPASE, BLOOD: Lipase: 61 U/L — ABNORMAL HIGH (ref 11–51)

## 2022-01-08 LAB — TROPONIN I (HIGH SENSITIVITY): Troponin I (High Sensitivity): 61 ng/L — ABNORMAL HIGH

## 2022-01-08 LAB — ACETAMINOPHEN LEVEL: Acetaminophen (Tylenol), Serum: <10 ug/mL — ABNORMAL LOW (ref 10–30)

## 2022-01-08 LAB — SALICYLATE LEVEL: Salicylate Lvl: <7 mg/dL — ABNORMAL LOW (ref 7.0–30.0)

## 2022-01-08 LAB — PROTIME-INR
INR: 1 (ref 0.8–1.2)
Prothrombin Time: 12.9 seconds (ref 11.4–15.2)

## 2022-01-08 LAB — CBG MONITORING, ED: Glucose-Capillary: 231 mg/dL — ABNORMAL HIGH (ref 70–99)

## 2022-01-08 LAB — ETHANOL: Alcohol, Ethyl (B): <10 mg/dL

## 2022-01-08 MED ORDER — ONDANSETRON HCL 4 MG/2ML IJ SOLN
4.0000 mg | Freq: Once | INTRAMUSCULAR | Status: AC
  Administered 2022-01-08: 4 mg via INTRAVENOUS
  Filled 2022-01-08: qty 2

## 2022-01-08 MED ORDER — LEVETIRACETAM IN NACL 1000 MG/100ML IV SOLN
1000.0000 mg | INTRAVENOUS | Status: AC
  Administered 2022-01-08: 1000 mg via INTRAVENOUS
  Filled 2022-01-08: qty 100

## 2022-01-08 MED ORDER — IOHEXOL 350 MG/ML SOLN
75.0000 mL | Freq: Once | INTRAVENOUS | Status: AC | PRN
  Administered 2022-01-08: 75 mL via INTRAVENOUS

## 2022-01-08 MED ORDER — MORPHINE SULFATE (PF) 4 MG/ML IV SOLN
4.0000 mg | Freq: Once | INTRAVENOUS | Status: AC
  Administered 2022-01-08: 4 mg via INTRAVENOUS
  Filled 2022-01-08: qty 1

## 2022-01-08 MED ORDER — LABETALOL HCL 5 MG/ML IV SOLN
10.0000 mg | Freq: Once | INTRAVENOUS | Status: AC
  Administered 2022-01-08: 10 mg via INTRAVENOUS
  Filled 2022-01-08: qty 4

## 2022-01-08 MED ORDER — NICARDIPINE HCL IN NACL 20-0.86 MG/200ML-% IV SOLN
0.0000 mg/h | INTRAVENOUS | Status: DC
  Administered 2022-01-08: 5 mg/h via INTRAVENOUS
  Filled 2022-01-08: qty 200

## 2022-01-08 NOTE — ED Notes (Signed)
Pt transported to CT ?

## 2022-01-08 NOTE — ED Notes (Signed)
Pt back from CT

## 2022-01-08 NOTE — ED Notes (Signed)
Pt reports nausea and excessive thirst ?

## 2022-01-08 NOTE — ED Provider Notes (Signed)
? ?[facility] ?Provider Note ? ? ? Event Date/Time  ? First MD Initiated Contact with Patient 01/08/22 [id number]   ?  (approximate) ? ? ?History  ? ?Altered Mental Status ? ? ?HPI ? ?Tanner Wells is a 46 y.o. male with a past history of hypertension, GERD, diverticulitis who is brought to the ED due to unresponsiveness.  Patient apparently had walked to the bathroom, but then was found on the floor by his father.  EMS reports patient was diaphoretic and confused and had fecal incontinence.  No known preceding symptoms such as fever. ? ?Currently patient complains of diffuse headache. ?  ? ? ?Physical Exam  ? ?Triage Vital Signs: ?ED Triage Vitals [01/08/22 1000]  ?Enc Vitals Group  ?   BP (!) 161/102  ?   Pulse Rate 86  ?   Resp (!) 21  ?   Temp   ?   Temp src   ?   SpO2 96 %  ?   Weight 238 lb 1.6 oz (108 kg)  ?   Height 5\' 10"  (1.778 m)  ?   Head Circumference   ?   Peak Flow   ?   Pain Score 0  ?   Pain Loc   ?   Pain Edu?   ?   Excl. in [place]?   ? ? ?Most recent vital signs: ?Vitals:  ? 01/08/22 1030 01/08/22 1112  ?BP: (!) 160/98 (!) 143/99  ?Pulse: 84 72  ?Resp: (!) 21 18  ?SpO2: 100% 95%  ? ? ? ?General: Awake, no distress.  GCS 14 for confusion.  Follows commands ?CV:  Good peripheral perfusion.  Regular rate and rhythm ?Resp:  Normal effort.  Clear to auscultation bilaterally ?Abd:  No distention.  Soft nontender ?Other:  Musculoskeletal exam unremarkable without deformities or wounds.  Long bones are stable.  C-spine nontender.  No evidence of head trauma.  Moist oral mucosa. ? ? ?ED Results / Procedures / Treatments  ? ?Labs ?(all labs ordered are listed, but only abnormal results are displayed) ?Labs Reviewed  ?ACETAMINOPHEN LEVEL - Abnormal; Notable for the following components:  ?    Result Value  ? Acetaminophen (Tylenol), Serum <10 (*)   ? All other components within normal limits  ?COMPREHENSIVE METABOLIC PANEL - Abnormal; Notable for the following components:  ? Potassium 3.4  (*)   ? CO2 18 (*)   ? Glucose, Bld 240 (*)   ? Calcium 8.5 (*)   ? AST 55 (*)   ? ALT 60 (*)   ? Anion gap 16 (*)   ? All other components within normal limits  ?LIPASE, BLOOD - Abnormal; Notable for the following components:  ? Lipase 61 (*)   ? All other components within normal limits  ?CBC WITH DIFFERENTIAL/PLATELET - Abnormal; Notable for the following components:  ? WBC 13.3 (*)   ? Hemoglobin 17.6 (*)   ? HCT 53.3 (*)   ? Neutro Abs 9.3 (*)   ? Abs Immature Granulocytes 0.08 (*)   ? All other components within normal limits  ?SALICYLATE LEVEL - Abnormal; Notable for the following components:  ? Salicylate Lvl [id number] (*)   ? All other components within normal limits  ?TROPONIN I (HIGH SENSITIVITY) - Abnormal; Notable for the following components:  ? Troponin I (High Sensitivity) 61 (*)   ? All other components within normal limits  ?ETHANOL  ?PROTIME-INR  ?URINE DRUG SCREEN, QUALITATIVE ([facility] ONLY)  ?URINALYSIS, ROUTINE  W REFLEX MICROSCOPIC  ?TROPONIN I (HIGH SENSITIVITY)  ? ? ? ?EKG ? ?Interpreted by me ?Sinus rhythm rate of 81.  Normal axis and intervals.  Normal QRS ST segments and T waves. ? ? ?RADIOLOGY ?CT head viewed and interpreted by me, shows bilateral subarachnoid hemorrhage.  Radiology report reviewed.  Discussed with radiologist. ? ? ? ?PROCEDURES: ? ?Critical Care performed: Yes, see critical care procedure note(s) ? ?.Critical Care ?Performed by: Carrie Mew, MD ?Authorized by: Carrie Mew, MD  ? ?Critical care provider statement:  ?  Critical care time (minutes):  35 ?  Critical care time was exclusive of:  Separately billable procedures and treating other patients ?  Critical care was necessary to treat or prevent imminent or life-threatening deterioration of the following conditions:  CNS failure or compromise and circulatory failure ?  Critical care was time spent personally by me on the following activities:  Development of treatment plan with patient or surrogate, discussions  with consultants, evaluation of patient's response to treatment, examination of patient, obtaining history from patient or surrogate, ordering and performing treatments and interventions, ordering and review of laboratory studies, ordering and review of radiographic studies, pulse oximetry, re-evaluation of patient's condition and review of old charts ? ? ?MEDICATIONS ORDERED IN ED: ?Medications  ?nicardipine (CARDENE) 20mg  in 0.86% saline 265ml IV infusion (0.1 mg/ml) (5 mg/hr Intravenous New Bag/Given 01/08/22 1053)  ?iohexol (OMNIPAQUE) 350 MG/ML injection 75 mL (has no administration in time range)  ?ondansetron (ZOFRAN) injection 4 mg (4 mg Intravenous Given 01/08/22 1044)  ?morphine (PF) 4 MG/ML injection 4 mg (4 mg Intravenous Given 01/08/22 1046)  ?levETIRAcetam (KEPPRA) IVPB 1000 mg/100 mL premix (0 mg Intravenous Stopped 01/08/22 1112)  ?labetalol (NORMODYNE) injection 10 mg (10 mg Intravenous Given 01/08/22 1046)  ? ? ? ?IMPRESSION / MDM / ASSESSMENT AND PLAN / ED COURSE  ?I reviewed the triage vital signs and the nursing notes. ?             ?               ? ?Differential diagnosis includes, but is not limited to, intracranial hemorrhage, intracranial mass, intoxication, dehydration, vagal episode, seizure ? ?Patient presents with syncope, arrives confused without recollection of what happened.  He is awake, GCS is 14, he is maintaining his airway, vital signs are unremarkable.  This is an acute new issue. ? ?CT scan shows large subarachnoid hemorrhage, concerning for possibly ruptured aneurysm.  We will consult neurosurgery.  We will give IV morphine and Zofran for pain and nausea relief, IV Keppra for seizure prophylaxis.  IV labetalol, Cardene infusion for strict blood pressure control, goal less than 140/90 at least. ? ?----------------------------------------- ?10:53 AM on 01/08/2022 ?----------------------------------------- ?Discussed with neurosurgery Dr. Cari Caraway who recommends transfer to [facility].  Recommends obtaining CT angiogram. ? ? ?Clinical Course as of 01/08/22 1136  ?Wed Jan 08, 2022  ?1119 Discussed with [facility] transfer center and endovascular neurosurgery Dr. Reyes Ivan who accepts for transfer. [facility] to transfer [PS]  ?84 Father updated [PS]  ?  ?Clinical Course User Index ?[PS] Carrie Mew, MD  ? ? ? ?FINAL CLINICAL IMPRESSION(S) / ED DIAGNOSES  ? ?Final diagnoses:  ?Subarachnoid hematoma with loss of consciousness, initial encounter ([place])  ? ? ? ?Rx / DC Orders  ? ?ED Discharge Orders   ? ? None  ? ?  ? ? ? ?Note:  This document was prepared using Dragon voice recognition software and may include unintentional  dictation errors. ?  ?Carrie Mew, MD ?01/08/22 1136 ? ?

## 2022-01-08 NOTE — ED Notes (Signed)
Pt accepted to [facility] ED. Transportation thru [employer] ?

## 2022-01-08 NOTE — ED Notes (Signed)
[facility] in ED at this time. ?

## 2022-01-08 NOTE — ED Triage Notes (Addendum)
Pt to ED via [facility] from home. Pt found unresponsive by dad in the bathroom. Pt diaphoretic and confused on EMS arrival. Pt had episode of incontinence. No known medical hx but EMS state pt is pre-diabetic. Pt reports alcohol and marijuana use last night. Pt alert to self only on arrival. CBG 248. 18g LFA.   ?

## 2022-03-22 DEATH — deceased
